# Patient Record
Sex: Male | Born: 1950 | Race: White | Hispanic: No | Marital: Married | Smoking: Current some day smoker
Health system: Southern US, Community
[De-identification: ages and names within clinical notes are randomized; demographics above are authoritative.]

## PROBLEM LIST (undated history)

## (undated) DIAGNOSIS — F319 Bipolar disorder, unspecified: Secondary | ICD-10-CM

## (undated) DIAGNOSIS — M1990 Unspecified osteoarthritis, unspecified site: Secondary | ICD-10-CM

## (undated) DIAGNOSIS — F32A Depression, unspecified: Secondary | ICD-10-CM

## (undated) DIAGNOSIS — F329 Major depressive disorder, single episode, unspecified: Secondary | ICD-10-CM

## (undated) DIAGNOSIS — H3530 Unspecified macular degeneration: Secondary | ICD-10-CM

## (undated) DIAGNOSIS — F419 Anxiety disorder, unspecified: Secondary | ICD-10-CM

## (undated) DIAGNOSIS — E785 Hyperlipidemia, unspecified: Secondary | ICD-10-CM

## (undated) DIAGNOSIS — B1920 Unspecified viral hepatitis C without hepatic coma: Secondary | ICD-10-CM

## (undated) HISTORY — DX: Anxiety disorder, unspecified: F41.9

## (undated) HISTORY — PX: ANKLE FRACTURE SURGERY: SHX122

## (undated) HISTORY — DX: Hyperlipidemia, unspecified: E78.5

## (undated) HISTORY — DX: Depression, unspecified: F32.A

## (undated) HISTORY — DX: Major depressive disorder, single episode, unspecified: F32.9

## (undated) HISTORY — DX: Bipolar disorder, unspecified: F31.9

## (undated) HISTORY — DX: Unspecified viral hepatitis C without hepatic coma: B19.20

## (undated) HISTORY — PX: COLONOSCOPY: SHX174

## (undated) HISTORY — DX: Unspecified macular degeneration: H35.30

## (undated) HISTORY — DX: Unspecified osteoarthritis, unspecified site: M19.90

## (undated) HISTORY — PX: ROTATOR CUFF REPAIR: SHX139

---

## 2001-06-07 HISTORY — PX: OTHER SURGICAL HISTORY: SHX169

## 2004-04-23 ENCOUNTER — Encounter: Admission: RE | Admit: 2004-04-23 | Discharge: 2004-04-23 | Payer: Self-pay | Admitting: Family Medicine

## 2004-04-23 ENCOUNTER — Ambulatory Visit: Payer: Self-pay | Admitting: Family Medicine

## 2004-05-01 ENCOUNTER — Encounter: Admission: RE | Admit: 2004-05-01 | Discharge: 2004-05-01 | Payer: Self-pay | Admitting: Family Medicine

## 2005-02-12 ENCOUNTER — Ambulatory Visit: Payer: Self-pay | Admitting: Family Medicine

## 2005-04-06 ENCOUNTER — Ambulatory Visit: Payer: Self-pay | Admitting: Family Medicine

## 2005-04-13 ENCOUNTER — Ambulatory Visit: Payer: Self-pay | Admitting: Family Medicine

## 2005-04-23 ENCOUNTER — Ambulatory Visit: Payer: Self-pay | Admitting: Internal Medicine

## 2005-06-23 ENCOUNTER — Ambulatory Visit: Payer: Self-pay | Admitting: Family Medicine

## 2005-07-09 ENCOUNTER — Ambulatory Visit: Payer: Self-pay | Admitting: Internal Medicine

## 2005-12-16 ENCOUNTER — Ambulatory Visit: Payer: Self-pay | Admitting: Family Medicine

## 2005-12-23 ENCOUNTER — Ambulatory Visit: Payer: Self-pay | Admitting: Family Medicine

## 2006-08-02 ENCOUNTER — Ambulatory Visit: Payer: Self-pay | Admitting: Family Medicine

## 2006-08-10 ENCOUNTER — Ambulatory Visit: Payer: Self-pay | Admitting: Family Medicine

## 2006-12-05 ENCOUNTER — Ambulatory Visit: Payer: Self-pay | Admitting: Family Medicine

## 2006-12-05 LAB — CONVERTED CEMR LAB
ALT: 25 units/L (ref 0–53)
AST: 30 units/L (ref 0–37)
Albumin: 4 g/dL (ref 3.5–5.2)
Alkaline Phosphatase: 56 units/L (ref 39–117)
BUN: 4 mg/dL — ABNORMAL LOW (ref 6–23)
Basophils Absolute: 0 10*3/uL (ref 0.0–0.1)
Basophils Relative: 0.6 % (ref 0.0–1.0)
Bilirubin, Direct: 0.1 mg/dL (ref 0.0–0.3)
CO2: 30 meq/L (ref 19–32)
Calcium: 9.3 mg/dL (ref 8.4–10.5)
Chloride: 112 meq/L (ref 96–112)
Cholesterol: 191 mg/dL (ref 0–200)
Creatinine, Ser: 0.9 mg/dL (ref 0.4–1.5)
Eosinophils Absolute: 0.1 10*3/uL (ref 0.0–0.6)
Eosinophils Relative: 1.8 % (ref 0.0–5.0)
GFR calc Af Amer: 112 mL/min
GFR calc non Af Amer: 93 mL/min
Glucose, Bld: 96 mg/dL (ref 70–99)
HCT: 45.6 % (ref 39.0–52.0)
HDL: 37.2 mg/dL — ABNORMAL LOW (ref >39.0)
Hemoglobin: 15.5 g/dL (ref 13.0–17.0)
LDL Cholesterol: 119 mg/dL — ABNORMAL HIGH (ref 0–99)
Lymphocytes Relative: 30.3 % (ref 12.0–46.0)
MCHC: 34 g/dL (ref 30.0–36.0)
MCV: 97.3 fL (ref 78.0–100.0)
Monocytes Absolute: 0.6 10*3/uL (ref 0.2–0.7)
Monocytes Relative: 7.9 % (ref 3.0–11.0)
Neutro Abs: 4.2 10*3/uL (ref 1.4–7.7)
Neutrophils Relative %: 59.4 % (ref 43.0–77.0)
PSA: 0.66 ng/mL (ref 0.10–4.00)
Platelets: 224 10*3/uL (ref 150–400)
Potassium: 4.1 meq/L (ref 3.5–5.1)
RBC: 4.69 M/uL (ref 4.22–5.81)
RDW: 12.2 % (ref 11.5–14.6)
Sodium: 145 meq/L (ref 135–145)
TSH: 0.98 microintl units/mL (ref 0.35–5.50)
Total Bilirubin: 0.6 mg/dL (ref 0.3–1.2)
Total CHOL/HDL Ratio: 5.1
Total Protein: 7 g/dL (ref 6.0–8.3)
Triglycerides: 174 mg/dL — ABNORMAL HIGH (ref 0–149)
VLDL: 35 mg/dL (ref 0–40)
WBC: 7 10*3/uL (ref 4.5–10.5)

## 2006-12-12 ENCOUNTER — Ambulatory Visit: Payer: Self-pay | Admitting: Family Medicine

## 2007-01-26 DIAGNOSIS — M1990 Unspecified osteoarthritis, unspecified site: Secondary | ICD-10-CM | POA: Insufficient documentation

## 2007-01-26 DIAGNOSIS — F418 Other specified anxiety disorders: Secondary | ICD-10-CM | POA: Insufficient documentation

## 2007-06-27 ENCOUNTER — Ambulatory Visit: Payer: Self-pay | Admitting: Internal Medicine

## 2007-06-27 DIAGNOSIS — B029 Zoster without complications: Secondary | ICD-10-CM | POA: Insufficient documentation

## 2008-08-30 ENCOUNTER — Ambulatory Visit: Payer: Self-pay | Admitting: Family Medicine

## 2008-08-30 LAB — CONVERTED CEMR LAB
ALT: 28 units/L (ref 0–53)
AST: 27 units/L (ref 0–37)
Albumin: 4.1 g/dL (ref 3.5–5.2)
Alkaline Phosphatase: 59 units/L (ref 39–117)
BUN: 14 mg/dL (ref 6–23)
Basophils Absolute: 0 10*3/uL (ref 0.0–0.1)
Basophils Relative: 0.8 % (ref 0.0–3.0)
Bilirubin Urine: NEGATIVE
Bilirubin, Direct: 0.1 mg/dL (ref 0.0–0.3)
CO2: 29 meq/L (ref 19–32)
Calcium: 9.1 mg/dL (ref 8.4–10.5)
Chloride: 112 meq/L (ref 96–112)
Cholesterol: 160 mg/dL (ref 0–200)
Creatinine, Ser: 0.9 mg/dL (ref 0.4–1.5)
Eosinophils Absolute: 0 10*3/uL (ref 0.0–0.7)
Eosinophils Relative: 0.1 % (ref 0.0–5.0)
GFR calc non Af Amer: 91.99 mL/min (ref >60)
Glucose, Bld: 108 mg/dL — ABNORMAL HIGH (ref 70–99)
HCT: 45.1 % (ref 39.0–52.0)
HDL: 40.4 mg/dL (ref >39.00)
Hemoglobin: 15.9 g/dL (ref 13.0–17.0)
Ketones, ur: NEGATIVE mg/dL
LDL Cholesterol: 99 mg/dL (ref 0–99)
Leukocytes, UA: NEGATIVE
Lymphocytes Relative: 32.5 % (ref 12.0–46.0)
Lymphs Abs: 1.7 10*3/uL (ref 0.7–4.0)
MCHC: 35.3 g/dL (ref 30.0–36.0)
MCV: 97.1 fL (ref 78.0–100.0)
Monocytes Absolute: 0.5 10*3/uL (ref 0.1–1.0)
Monocytes Relative: 9.2 % (ref 3.0–12.0)
Neutro Abs: 3.1 10*3/uL (ref 1.4–7.7)
Neutrophils Relative %: 57.4 % (ref 43.0–77.0)
Nitrite: NEGATIVE
PSA: 2.18 ng/mL (ref 0.10–4.00)
Platelets: 208 10*3/uL (ref 150.0–400.0)
Potassium: 4.8 meq/L (ref 3.5–5.1)
RBC: 4.64 M/uL (ref 4.22–5.81)
RDW: 12.2 % (ref 11.5–14.6)
Sodium: 145 meq/L (ref 135–145)
Specific Gravity, Urine: <=1.005 (ref 1.000–1.030)
TSH: 0.55 microintl units/mL (ref 0.35–5.50)
Total Bilirubin: 0.7 mg/dL (ref 0.3–1.2)
Total CHOL/HDL Ratio: 4
Total Protein, Urine: NEGATIVE mg/dL
Total Protein: 6.8 g/dL (ref 6.0–8.3)
Triglycerides: 101 mg/dL (ref 0.0–149.0)
Urine Glucose: NEGATIVE mg/dL
Urobilinogen, UA: 0.2 (ref 0.0–1.0)
VLDL: 20.2 mg/dL (ref 0.0–40.0)
WBC: 5.3 10*3/uL (ref 4.5–10.5)
pH: 5.5 (ref 5.0–8.0)

## 2008-09-10 ENCOUNTER — Encounter: Payer: Self-pay | Admitting: Family Medicine

## 2008-09-11 ENCOUNTER — Ambulatory Visit: Payer: Self-pay | Admitting: Family Medicine

## 2008-09-11 DIAGNOSIS — E785 Hyperlipidemia, unspecified: Secondary | ICD-10-CM | POA: Insufficient documentation

## 2008-09-11 DIAGNOSIS — F528 Other sexual dysfunction not due to a substance or known physiological condition: Secondary | ICD-10-CM | POA: Insufficient documentation

## 2009-11-26 ENCOUNTER — Telehealth: Payer: Self-pay | Admitting: Family Medicine

## 2009-12-19 ENCOUNTER — Ambulatory Visit: Payer: Self-pay | Admitting: Family Medicine

## 2009-12-19 LAB — CONVERTED CEMR LAB
ALT: 30 units/L (ref 0–53)
AST: 31 units/L (ref 0–37)
Albumin: 4.6 g/dL (ref 3.5–5.2)
Alkaline Phosphatase: 64 units/L (ref 39–117)
BUN: 9 mg/dL (ref 6–23)
Basophils Absolute: 0 10*3/uL (ref 0.0–0.1)
Basophils Relative: 0.7 % (ref 0.0–3.0)
Bilirubin Urine: NEGATIVE
Bilirubin, Direct: 0.1 mg/dL (ref 0.0–0.3)
CO2: 28 meq/L (ref 19–32)
Calcium: 9.2 mg/dL (ref 8.4–10.5)
Chloride: 112 meq/L (ref 96–112)
Cholesterol: 155 mg/dL (ref 0–200)
Creatinine, Ser: 0.9 mg/dL (ref 0.4–1.5)
Eosinophils Absolute: 0 10*3/uL (ref 0.0–0.7)
Eosinophils Relative: 0.1 % (ref 0.0–5.0)
GFR calc non Af Amer: 92.76 mL/min (ref >60)
Glucose, Bld: 89 mg/dL (ref 70–99)
HCT: 45.3 % (ref 39.0–52.0)
HDL: 44.2 mg/dL (ref >39.00)
Hemoglobin: 15.8 g/dL (ref 13.0–17.0)
Ketones, ur: NEGATIVE mg/dL
LDL Cholesterol: 96 mg/dL (ref 0–99)
Leukocytes, UA: NEGATIVE
Lymphocytes Relative: 26.8 % (ref 12.0–46.0)
Lymphs Abs: 1.8 10*3/uL (ref 0.7–4.0)
MCHC: 34.8 g/dL (ref 30.0–36.0)
MCV: 96.8 fL (ref 78.0–100.0)
Monocytes Absolute: 0.6 10*3/uL (ref 0.1–1.0)
Monocytes Relative: 8.3 % (ref 3.0–12.0)
Neutro Abs: 4.3 10*3/uL (ref 1.4–7.7)
Neutrophils Relative %: 64.1 % (ref 43.0–77.0)
Nitrite: NEGATIVE
PSA: 2.69 ng/mL (ref 0.10–4.00)
Platelets: 202 10*3/uL (ref 150.0–400.0)
Potassium: 4.5 meq/L (ref 3.5–5.1)
RBC: 4.69 M/uL (ref 4.22–5.81)
RDW: 12.7 % (ref 11.5–14.6)
Sodium: 142 meq/L (ref 135–145)
Specific Gravity, Urine: <=1.005 (ref 1.000–1.030)
TSH: 0.53 microintl units/mL (ref 0.35–5.50)
Total Bilirubin: 0.5 mg/dL (ref 0.3–1.2)
Total CHOL/HDL Ratio: 4
Total Protein, Urine: NEGATIVE mg/dL
Total Protein: 7.5 g/dL (ref 6.0–8.3)
Triglycerides: 74 mg/dL (ref 0.0–149.0)
Urine Glucose: NEGATIVE mg/dL
Urobilinogen, UA: 0.2 (ref 0.0–1.0)
VLDL: 14.8 mg/dL (ref 0.0–40.0)
WBC: 6.8 10*3/uL (ref 4.5–10.5)
pH: 7 (ref 5.0–8.0)

## 2009-12-26 ENCOUNTER — Ambulatory Visit: Payer: Self-pay | Admitting: Family Medicine

## 2010-07-07 NOTE — Progress Notes (Signed)
Summary: REQUEST FOR REFILL OF MEDICATION (CPX SCHEDULED FOR 12/26/2009)  Phone Note Refill Request Message from:  Patient's wife on November 26, 2009 12:45 PM  Refills Requested: Medication #1:  SIMVASTATIN 20 MG TABS 1 once daily   Notes: [facility] [address].... Pt would like a refill amt for enough of medication to do him till his CPX appt on 12/26/2009.    Initial call taken by: Debbra Riding,  November 26, 2009 12:45 PM    Prescriptions: SIMVASTATIN 20 MG TABS (SIMVASTATIN) 1 once daily  #30 x 0   Entered by:   Kern Reap CMA (AAMA)   Authorized by:   Roderick Pee MD   Signed by:   Kern Reap CMA (AAMA) on 11/26/2009   Method used:   [facility]        [facility]  [address] [phone]* (retail)       [address]       [place], [place]  [address]       Ph: [phone] or [phone]       Fax: [phone]   RxID:   [id number]

## 2010-07-07 NOTE — Assessment & Plan Note (Signed)
Summary: CPX // RS   Vital Signs:  Patient profile:   60 year old male Height:      67.25 inches Weight:      145 pounds BMI:     22.62 Temp:     98.4 degrees F oral BP sitting:   120 / 60  (left arm) Cuff size:   regular  Vitals Entered By: Kern Reap CMA Duncan Dull) (December 26, 2009 1:42 PM)  CC: cpx   CC:  cpx.  History of Present Illness: John Holland is a 60 year old, married male, smoker 10 cigarettes. per day........ who declines to try a smoking cessation program....... who comes in today for annual physical examination  He takes simvastatin 20 mg daily for hyperlipidemia.  Lipids are ago with an LDL of 96.  Dr. Nolen Mu, his psychiatrist has him on Lamictal 150 mg daily, Seroquel 20 mg b.i.d., Valium, 5 -- 10 -- 5 to control anxiety and depression.  He declines Viagra.  He gets routine eye care.  Dental care.  Colonoscopy normal.  Tetanus 2008.  Allergies: 1)  Codeine Phosphate (Codeine Phosphate)  Past History:  Past medical, surgical, family and social histories (including risk factors) reviewed, and no changes noted (except as noted below).  Past Medical History: Reviewed history from 01/26/2007 and no changes required. Depression Osteoarthritis  Past Surgical History: Reviewed history from 01/26/2007 and no changes required. Colonoscopy-07/09/2005  Family History: Reviewed history from 01/26/2007 and no changes required. Family History of Alcoholism/Addiction Fam hx MI Fam hx COPD  Social History: Reviewed history from 09/11/2008 and no changes required. Married Current Smoker Alcohol use-no Drug use-no Regular exercise-no  Review of Systems      See HPI  Physical Exam  General:  Well-developed,well-nourished,in no acute distress; alert,appropriate and cooperative throughout examination Head:  Normocephalic and atraumatic without obvious abnormalities. No apparent alopecia or balding. Eyes:  No corneal or conjunctival inflammation noted. EOMI.  Perrla. Funduscopic exam benign, without hemorrhages, exudates or papilledema. Vision grossly normal. Ears:  External ear exam shows no significant lesions or deformities.  Otoscopic examination reveals clear canals, tympanic membranes are intact bilaterally without bulging, retraction, inflammation or discharge. Hearing is grossly normal bilaterally. Nose:  External nasal examination shows no deformity or inflammation. Nasal mucosa are pink and moist without lesions or exudates. Mouth:  Oral mucosa and oropharynx without lesions or exudates.  Teeth in good repair. Neck:  No deformities, masses, or tenderness noted. Chest Wall:  No deformities, masses, tenderness or gynecomastia noted. Breasts:  No masses or gynecomastia noted Lungs:  Normal respiratory effort, chest expands symmetrically. Lungs are clear to auscultation, no crackles or wheezes. Heart:  Normal rate and regular rhythm. S1 and S2 normal without gallop, murmur, click, rub or other extra sounds. Abdomen:  Bowel sounds positive,abdomen soft and non-tender without masses, organomegaly or hernias noted. Rectal:  No external abnormalities noted. Normal sphincter tone. No rectal masses or tenderness. Genitalia:  Testes bilaterally descended without nodularity, tenderness or masses. No scrotal masses or lesions. No penis lesions or urethral discharge. Prostate:  Prostate gland firm and smooth, no enlargement, nodularity, tenderness, mass, asymmetry or induration. Msk:  No deformity or scoliosis noted of thoracic or lumbar spine.   Pulses:  R and L carotid,radial,femoral,dorsalis pedis and posterior tibial pulses are full and equal bilaterally Extremities:  No clubbing, cyanosis, edema, or deformity noted with normal full range of motion of all joints.   Neurologic:  No cranial nerve deficits noted. Station and gait are normal. Plantar reflexes are down-going  bilaterally. DTRs are symmetrical throughout. Sensory, motor and coordinative  functions appear intact. Skin:  Intact without suspicious lesions or rashes Cervical Nodes:  No lymphadenopathy noted Axillary Nodes:  No palpable lymphadenopathy Inguinal Nodes:  No significant adenopathy Psych:  Cognition and judgment appear intact. Alert and cooperative with normal attention span and concentration. No apparent delusions, illusions, hallucinations   Impression & Recommendations:  Problem # 1:  TOBACCO ABUSE (ICD-305.1) Assessment Unchanged  The following medications were removed from the medication list:    Chantix Starting Month Pak 0.5 Mg X 11 & 1 Mg X 42 Tabs (Varenicline tartrate) ..... Uad  Orders: Prescription Created Electronically [phone])  Problem # 2:  HYPERLIPIDEMIA (ICD-272.4) Assessment: Improved  His updated medication list for this problem includes:    Simvastatin 20 Mg Tabs (Simvastatin) .Marland Kitchen... 1 once daily  Orders: Prescription Created Electronically [phone]) EKG w/ Interpretation (93000)  Problem # 3:  DEPRESSION (ICD-311) Assessment: Improved  His updated medication list for this problem includes:    Valium 5 Mg Tabs (Diazepam) .Marland Kitchen... 1 three times a day  Problem # 4:  Preventive Health Care (ICD-V70.0) Assessment: Unchanged  Complete Medication List: 1)  Simvastatin 20 Mg Tabs (Simvastatin) .Marland Kitchen.. 1 once daily 2)  Lamictal 150 Mg Tabs (Lamotrigine) .Marland Kitchen.. 1 once daily 3)  Seroquel 100 Mg Tabs (Quetiapine fumarate) .... Take 2 half tabs in the am and 2 tabs at bedtime 4)  Valium 5 Mg Tabs (Diazepam) .Marland Kitchen.. 1 three times a day 5)  Viagra 50 Mg Tabs (Sildenafil citrate) .... Uad  Patient Instructions: 1)  Please schedule a follow-up appointment in 1 year. 2)  Please schedule a follow-up appointment as needed. Prescriptions: SIMVASTATIN 20 MG TABS (SIMVASTATIN) 1 once daily  #100 x 3   Entered and Authorized by:   Roderick Pee MD   Signed by:   Roderick Pee MD on 12/26/2009   Method used:   [facility]        [facility]  [address] [phone]* (retail)       [address]       [place], [place]  [address]       Ph: [phone] or [phone]       Fax: [phone]   RxID:   [id number]

## 2010-11-09 ENCOUNTER — Telehealth: Payer: Self-pay | Admitting: *Deleted

## 2010-11-09 DIAGNOSIS — M1990 Unspecified osteoarthritis, unspecified site: Secondary | ICD-10-CM

## 2010-11-09 NOTE — Telephone Encounter (Signed)
John Holland

## 2010-11-09 NOTE — Telephone Encounter (Signed)
Pt would like to have the referral to the hand surgeon that he and Dr. Tawanna Cooler talked about.

## 2010-11-10 NOTE — Telephone Encounter (Signed)
Pt called back and is req status of getting a referral. Pls call asap.

## 2011-01-13 ENCOUNTER — Other Ambulatory Visit: Payer: Self-pay | Admitting: *Deleted

## 2011-01-13 MED ORDER — SIMVASTATIN 20 MG PO TABS: 20.0000 mg | ORAL_TABLET | Freq: Every day | ORAL | Status: DC

## 2011-01-21 ENCOUNTER — Other Ambulatory Visit: Payer: Self-pay | Admitting: *Deleted

## 2011-01-21 MED ORDER — ACETAMINOPHEN-CODEINE #3 300-30 MG PO TABS: 1.0000 | ORAL_TABLET | ORAL | Status: AC | PRN

## 2011-01-21 NOTE — Telephone Encounter (Signed)
Patient request rx.  Ok per Dr Tawanna Cooler.

## 2011-03-05 ENCOUNTER — Emergency Department (HOSPITAL_COMMUNITY): Payer: BC Managed Care – PPO

## 2011-03-05 ENCOUNTER — Emergency Department (HOSPITAL_COMMUNITY)
Admission: EM | Admit: 2011-03-05 | Discharge: 2011-03-05 | Disposition: A | Discharge status: Home / Self Care | Payer: BC Managed Care – PPO | Attending: Emergency Medicine | Admitting: Emergency Medicine

## 2011-03-05 DIAGNOSIS — Z23 Encounter for immunization: Secondary | ICD-10-CM | POA: Insufficient documentation

## 2011-03-05 DIAGNOSIS — F3289 Other specified depressive episodes: Secondary | ICD-10-CM | POA: Insufficient documentation

## 2011-03-05 DIAGNOSIS — F329 Major depressive disorder, single episode, unspecified: Secondary | ICD-10-CM | POA: Insufficient documentation

## 2011-03-05 DIAGNOSIS — S62639B Displaced fracture of distal phalanx of unspecified finger, initial encounter for open fracture: Secondary | ICD-10-CM | POA: Insufficient documentation

## 2011-03-05 DIAGNOSIS — W309XXA Contact with unspecified agricultural machinery, initial encounter: Secondary | ICD-10-CM | POA: Insufficient documentation

## 2011-03-05 DIAGNOSIS — F411 Generalized anxiety disorder: Secondary | ICD-10-CM | POA: Insufficient documentation

## 2011-03-31 ENCOUNTER — Other Ambulatory Visit: Payer: Self-pay | Admitting: Family Medicine

## 2011-07-01 ENCOUNTER — Other Ambulatory Visit: Payer: Self-pay | Admitting: *Deleted

## 2011-07-01 MED ORDER — SIMVASTATIN 20 MG PO TABS: 20.0000 mg | ORAL_TABLET | Freq: Every day | ORAL | Status: DC

## 2011-08-23 ENCOUNTER — Encounter: Payer: Self-pay | Admitting: Internal Medicine

## 2011-09-09 ENCOUNTER — Encounter: Payer: Self-pay | Admitting: Internal Medicine

## 2011-10-11 ENCOUNTER — Encounter: Payer: Self-pay | Admitting: Internal Medicine

## 2011-10-11 ENCOUNTER — Ambulatory Visit (AMBULATORY_SURGERY_CENTER): Payer: BC Managed Care – PPO | Admitting: *Deleted

## 2011-10-11 VITALS — Ht 68.0 in | Wt 146.5 lb

## 2011-10-11 DIAGNOSIS — Z1211 Encounter for screening for malignant neoplasm of colon: Secondary | ICD-10-CM

## 2011-10-11 MED ORDER — PEG-KCL-NACL-NASULF-NA ASC-C 100 G PO SOLR: ORAL | Status: DC

## 2011-10-25 ENCOUNTER — Encounter: Payer: BC Managed Care – PPO | Admitting: Internal Medicine

## 2011-10-27 ENCOUNTER — Encounter: Payer: Self-pay | Admitting: Internal Medicine

## 2011-10-27 ENCOUNTER — Ambulatory Visit (AMBULATORY_SURGERY_CENTER): Payer: BC Managed Care – PPO | Admitting: Internal Medicine

## 2011-10-27 VITALS — BP 157/95 | HR 71 | Temp 97.0°F | Resp 20 | Ht 68.0 in | Wt 146.0 lb

## 2011-10-27 DIAGNOSIS — D126 Benign neoplasm of colon, unspecified: Secondary | ICD-10-CM

## 2011-10-27 DIAGNOSIS — Z1211 Encounter for screening for malignant neoplasm of colon: Secondary | ICD-10-CM

## 2011-10-27 MED ORDER — SODIUM CHLORIDE 0.9 % IV SOLN: 500.0000 mL | INTRAVENOUS | Status: DC

## 2011-10-27 NOTE — Op Note (Signed)
[facility] [address] [place], [place]  [address]  COLONOSCOPY PROCEDURE REPORT  PATIENT:  John Holland, John Holland  MR#:  [id number] BIRTHDATE:  07-27-1950, 60 yrs. old  GENDER:  male ENDOSCOPIST:  Wilhemina Bonito. Eda Keys, MD REF. BY:  Screening / Recall PROCEDURE DATE:  10/27/2011 PROCEDURE:  Colonoscopy with snare polypectomy x 1 ASA CLASS:  Class II INDICATIONS:  Screening MEDICATIONS:   MAC sedation, administered by CRNA, propofol (Diprivan) 220 mg IV  DESCRIPTION OF PROCEDURE:   After the risks benefits and alternatives of the procedure were thoroughly explained, informed consent was obtained.  Digital rectal exam was performed and revealed no abnormalities.   The LB [id number] endoscope was introduced through the anus and advanced to the cecum, which was identified by both the appendix and ileocecal valve, without limitations.  The quality of the prep was good, using MoviPrep. The instrument was then slowly withdrawn as the colon was fully examined. <<PROCEDUREIMAGES>>  FINDINGS:  A diminutive polyp was found in the sigmoid colon and snared without cautery. Retrieval was successful. Moderate diverticulosis was found in the sigmoid colon.  Otherwise normal colonoscopy without other polyps, masses, vascular ectasias, or inflammatory changes.   Retroflexed views in the rectum revealed no abnormalities.    The time to cecum = 2:13  minutes. The scope was then withdrawn in 13:33  minutes from the cecum and the procedure completed.  COMPLICATIONS:  None  ENDOSCOPIC IMPRESSION: 1) Diminutive polyp in the sigmoid colon - removed 2) Moderate diverticulosis in the sigmoid colon 3) Otherwise normal colonoscopy  RECOMMENDATIONS: 1) Repeat colonoscopy in 5 years if polyp adenomatous; otherwise 10 years  ______________________________ Wilhemina Bonito. Eda Keys, MD  CC:  The Patient;  Roderick Pee, MD  n. Rosalie DoctorWilhemina Bonito. Eda Keys at 10/27/2011 02:21 PM  [name],  [phone]

## 2011-10-27 NOTE — Progress Notes (Signed)
Patient did not experience any of the following events: a burn prior to discharge; a fall within the facility; wrong site/side/patient/procedure/implant event; or a hospital transfer or hospital admission upon discharge from the facility. (G8907) Patient did not have preoperative order for IV antibiotic SSI prophylaxis. (G8918)  

## 2011-10-27 NOTE — Patient Instructions (Signed)

## 2011-10-28 ENCOUNTER — Telehealth: Payer: Self-pay | Admitting: *Deleted

## 2011-10-28 NOTE — Telephone Encounter (Signed)
  Follow up Call-  Call back number 10/27/2011  Post procedure Call Back phone  # [phone]  Permission to leave phone message Yes     Patient questions:  Do you have a fever, pain , or abdominal swelling? no Pain Score  0 *  Have you tolerated food without any problems? yes  Have you been able to return to your normal activities? yes  Do you have any questions about your discharge instructions: Diet   no Medications  no Follow up visit  no  Do you have questions or concerns about your Care? no  Actions: * If pain score is 4 or above: No action needed, pain <4.

## 2011-11-03 ENCOUNTER — Encounter: Payer: Self-pay | Admitting: Internal Medicine

## 2011-11-04 ENCOUNTER — Other Ambulatory Visit: Payer: Self-pay | Admitting: Orthopedic Surgery

## 2011-11-05 ENCOUNTER — Encounter (HOSPITAL_BASED_OUTPATIENT_CLINIC_OR_DEPARTMENT_OTHER): Payer: Self-pay | Admitting: *Deleted

## 2011-11-05 NOTE — Progress Notes (Signed)
Told he was staying [facility] To bring all meds,overnight bag No labs needed

## 2011-11-08 NOTE — H&P (Signed)
  John Holland is an 61 y.o. male.   Chief Complaint: c/o chronic and persistent right shoulder pain and limited ROM HPI:.  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  He has had history of bilateral hand pain right greater than left for the past one year.  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  He has right shoulder pain that bothers him at night.  He has to roll off the right shoulder.  He still has full motion and excellent strength. He denies any numbness.  He has no antecedent history of injury.  His ancestors are Solicitor.  He is aware of family history of some arthrosis.  He is 5'8" tall and weighs 140 pounds.  He has been using Aleve 440 mg. in the morning, 440 mg. in the evening for his pain without stomach upset.     Past Medical History  Diagnosis Date  . Bipolar 1 disorder   . Anxiety   . Depression   . Hyperlipidemia   . Arthritis   . Hyperlipemia     Past Surgical History  Procedure Date  . Arthroscopy arm 2003    right elbow bone frag  . Ankle fracture surgery     age 60-lt  . Colonoscopy     Family History  Problem Relation Age of Onset  . Colon cancer Neg Hx   . Stomach cancer Neg Hx    Social History:  reports that he quit smoking about 2 years ago. He has never used smokeless tobacco. He reports that he does not drink alcohol or use illicit drugs.  Allergies:  Allergies  Allergen Reactions  . Codeine Phosphate Nausea And Vomiting    No prescriptions prior to admission    No results found for this or any previous visit (from the past 48 hour(s)).  No results found.   Pertinent items are noted in HPI.  There were no vitals taken for this visit.  General appearance: alert Head: Normocephalic, without obvious abnormality Neck: supple, symmetrical, trachea  midline Resp: clear to auscultation bilaterally Cardio: regular rate and rhythm GI: normal findings: bowel sounds normal Extremities: On examination he has elevation to 165 degrees right vs. 170 left.  He is weak in scaption, external rotation at neutral and in 90 degrees abduction.  He has positive impingement sign and a clicking beneath the acromion with rapid elevation of his shoulder.   Plain x-ray of his shoulder demonstrates significant AC arthrosis and remodeling at the greater tuberosity consistent with rotator cuff tear.   MRI was obtained at [facility] on 09/17/11.  This reveals a large retracted tear of the supraspinatus, significant interstitial tearing of the infraspinatus and unfavorable AC anatomy.  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 The procedure, risks and post-op course were discussed at length and the patient was in agreement with the plan.  DASNOIT,Lekeith Wulf J 11/08/2011, 12:47 PM    H&P documentation: 11/09/2011  -History and Physical Reviewed  -Patient has been re-examined  -No change in the plan of care  Wyn Forster, MD

## 2011-11-09 ENCOUNTER — Encounter (HOSPITAL_BASED_OUTPATIENT_CLINIC_OR_DEPARTMENT_OTHER): Payer: Self-pay | Admitting: Anesthesiology

## 2011-11-09 ENCOUNTER — Encounter (HOSPITAL_BASED_OUTPATIENT_CLINIC_OR_DEPARTMENT_OTHER): Payer: Self-pay

## 2011-11-09 ENCOUNTER — Ambulatory Visit (HOSPITAL_BASED_OUTPATIENT_CLINIC_OR_DEPARTMENT_OTHER): Payer: BC Managed Care – PPO | Admitting: Anesthesiology

## 2011-11-09 ENCOUNTER — Ambulatory Visit (HOSPITAL_BASED_OUTPATIENT_CLINIC_OR_DEPARTMENT_OTHER)
Admission: RE | Admit: 2011-11-09 | Discharge: 2011-11-10 | Disposition: A | Discharge status: Home / Self Care | Payer: BC Managed Care – PPO | Source: Ambulatory Visit | Attending: Orthopedic Surgery | Admitting: Orthopedic Surgery

## 2011-11-09 ENCOUNTER — Encounter (HOSPITAL_BASED_OUTPATIENT_CLINIC_OR_DEPARTMENT_OTHER)
Admission: RE | Disposition: A | Discharge status: Home / Self Care | Payer: Self-pay | Source: Ambulatory Visit | Attending: Orthopedic Surgery

## 2011-11-09 DIAGNOSIS — F3130 Bipolar disorder, current episode depressed, mild or moderate severity, unspecified: Secondary | ICD-10-CM | POA: Insufficient documentation

## 2011-11-09 DIAGNOSIS — M719 Bursopathy, unspecified: Secondary | ICD-10-CM | POA: Insufficient documentation

## 2011-11-09 DIAGNOSIS — M67919 Unspecified disorder of synovium and tendon, unspecified shoulder: Secondary | ICD-10-CM | POA: Insufficient documentation

## 2011-11-09 DIAGNOSIS — E785 Hyperlipidemia, unspecified: Secondary | ICD-10-CM | POA: Insufficient documentation

## 2011-11-09 DIAGNOSIS — M19019 Primary osteoarthritis, unspecified shoulder: Secondary | ICD-10-CM | POA: Insufficient documentation

## 2011-11-09 DIAGNOSIS — Z5333 Arthroscopic surgical procedure converted to open procedure: Secondary | ICD-10-CM | POA: Insufficient documentation

## 2011-11-09 DIAGNOSIS — M24119 Other articular cartilage disorders, unspecified shoulder: Secondary | ICD-10-CM | POA: Insufficient documentation

## 2011-11-09 LAB — POCT HEMOGLOBIN-HEMACUE: Hemoglobin: 13.3 g/dL (ref 13.0–17.0)

## 2011-11-09 SURGERY — ARTHROSCOPY, SHOULDER, WITH ROTATOR CUFF REPAIR
Anesthesia: General | Site: Shoulder | Laterality: Right | Wound class: Clean

## 2011-11-09 MED ORDER — METHOCARBAMOL 500 MG PO TABS: 500.0000 mg | ORAL_TABLET | Freq: Four times a day (QID) | ORAL | Status: DC | PRN

## 2011-11-09 MED ORDER — HYDROMORPHONE HCL PF 1 MG/ML IJ SOLN: 0.2500 mg | INTRAMUSCULAR | Status: DC | PRN

## 2011-11-09 MED ORDER — HYDROMORPHONE HCL 4 MG PO TABS
4.0000 mg | ORAL_TABLET | ORAL | Status: DC | PRN
  Administered 2011-11-10 (×2): 4 mg via ORAL

## 2011-11-09 MED ORDER — HYDROMORPHONE HCL 2 MG PO TABS: ORAL_TABLET | ORAL | Status: AC

## 2011-11-09 MED ORDER — EPHEDRINE SULFATE 50 MG/ML IJ SOLN
INTRAMUSCULAR | Status: DC | PRN
  Administered 2011-11-09: 10 mg via INTRAVENOUS

## 2011-11-09 MED ORDER — SODIUM CHLORIDE 0.9 % IV SOLN
INTRAVENOUS | Status: DC
  Administered 2011-11-09: 16:00:00 via INTRAVENOUS

## 2011-11-09 MED ORDER — FENTANYL CITRATE 0.05 MG/ML IJ SOLN
100.0000 ug | INTRAMUSCULAR | Status: DC | PRN
  Administered 2011-11-09: 100 ug via INTRAVENOUS

## 2011-11-09 MED ORDER — METOCLOPRAMIDE HCL 5 MG PO TABS: 5.0000 mg | ORAL_TABLET | Freq: Three times a day (TID) | ORAL | Status: DC | PRN

## 2011-11-09 MED ORDER — SODIUM CHLORIDE 0.9 % IR SOLN
Status: DC | PRN
  Administered 2011-11-09: 2

## 2011-11-09 MED ORDER — METOCLOPRAMIDE HCL 5 MG/ML IJ SOLN: 5.0000 mg | Freq: Three times a day (TID) | INTRAMUSCULAR | Status: DC | PRN

## 2011-11-09 MED ORDER — CEFAZOLIN SODIUM 1-5 GM-% IV SOLN
INTRAVENOUS | Status: DC | PRN
  Administered 2011-11-09: 1 g via INTRAVENOUS

## 2011-11-09 MED ORDER — ONDANSETRON HCL 4 MG/2ML IJ SOLN: 4.0000 mg | Freq: Four times a day (QID) | INTRAMUSCULAR | Status: DC | PRN

## 2011-11-09 MED ORDER — CEFAZOLIN SODIUM 1-5 GM-% IV SOLN
1.0000 g | Freq: Four times a day (QID) | INTRAVENOUS | Status: DC
  Administered 2011-11-09 – 2011-11-10 (×2): 1 g via INTRAVENOUS

## 2011-11-09 MED ORDER — HYDROMORPHONE HCL PF 1 MG/ML IJ SOLN: 0.5000 mg | INTRAMUSCULAR | Status: DC | PRN

## 2011-11-09 MED ORDER — DEXAMETHASONE SODIUM PHOSPHATE 4 MG/ML IJ SOLN
INTRAMUSCULAR | Status: DC | PRN
  Administered 2011-11-09: 10 mg via INTRAVENOUS

## 2011-11-09 MED ORDER — HYDROMORPHONE HCL 2 MG PO TABS: 2.0000 mg | ORAL_TABLET | ORAL | Status: DC | PRN

## 2011-11-09 MED ORDER — HYDROMORPHONE HCL 2 MG PO TABS: 2.0000 mg | ORAL_TABLET | ORAL | Status: AC | PRN

## 2011-11-09 MED ORDER — CHLORHEXIDINE GLUCONATE 4 % EX LIQD: 60.0000 mL | Freq: Once | CUTANEOUS | Status: DC

## 2011-11-09 MED ORDER — ROPIVACAINE HCL 5 MG/ML IJ SOLN
INTRAMUSCULAR | Status: DC | PRN
  Administered 2011-11-09: 30 mL via EPIDURAL

## 2011-11-09 MED ORDER — ONDANSETRON HCL 4 MG/2ML IJ SOLN
INTRAMUSCULAR | Status: DC | PRN
  Administered 2011-11-09: 4 mg via INTRAVENOUS

## 2011-11-09 MED ORDER — ONDANSETRON HCL 4 MG PO TABS: 4.0000 mg | ORAL_TABLET | Freq: Four times a day (QID) | ORAL | Status: DC | PRN

## 2011-11-09 MED ORDER — METHOCARBAMOL 100 MG/ML IJ SOLN: 500.0000 mg | Freq: Four times a day (QID) | INTRAVENOUS | Status: DC | PRN

## 2011-11-09 MED ORDER — CEPHALEXIN 500 MG PO CAPS: 500.0000 mg | ORAL_CAPSULE | Freq: Three times a day (TID) | ORAL | Status: AC

## 2011-11-09 MED ORDER — ZOLPIDEM TARTRATE 5 MG PO TABS: 5.0000 mg | ORAL_TABLET | Freq: Every evening | ORAL | Status: DC | PRN

## 2011-11-09 MED ORDER — GLYCOPYRROLATE 0.2 MG/ML IJ SOLN
INTRAMUSCULAR | Status: DC | PRN
  Administered 2011-11-09: 0.2 mg via INTRAVENOUS

## 2011-11-09 MED ORDER — MIDAZOLAM HCL 2 MG/2ML IJ SOLN
2.0000 mg | INTRAMUSCULAR | Status: DC | PRN
  Administered 2011-11-09: 2 mg via INTRAVENOUS

## 2011-11-09 MED ORDER — PROPOFOL 10 MG/ML IV EMUL
INTRAVENOUS | Status: DC | PRN
  Administered 2011-11-09: 150 mg via INTRAVENOUS

## 2011-11-09 MED ORDER — FENTANYL CITRATE 0.05 MG/ML IJ SOLN
INTRAMUSCULAR | Status: DC | PRN
  Administered 2011-11-09: 100 ug via INTRAVENOUS

## 2011-11-09 MED ORDER — SUCCINYLCHOLINE CHLORIDE 20 MG/ML IJ SOLN
INTRAMUSCULAR | Status: DC | PRN
  Administered 2011-11-09: 100 mg via INTRAVENOUS

## 2011-11-09 MED ORDER — LACTATED RINGERS IV SOLN
INTRAVENOUS | Status: DC
  Administered 2011-11-09 (×3): via INTRAVENOUS

## 2011-11-09 SURGICAL SUPPLY — 82 items
ANCH SUT 2 FT CRKSW 14.7X5.5 (Anchor) ×1 IMPLANT
ANCH SUT SWLK 19.1 CLS EYLT TL (Anchor) ×1 IMPLANT
ANCH SUT SWLK 19.1X4.75 (Anchor) ×2 IMPLANT
ANCHOR CORKSCREW FIBER 5.5X15 (Anchor) ×1 IMPLANT
ANCHOR SUT BIO SW 4.75 W/FIB (Anchor) ×1 IMPLANT
ANCHOR SUT BIO SW 4.75X19.1 (Anchor) ×2 IMPLANT
BANDAGE ADHESIVE 1X3 (GAUZE/BANDAGES/DRESSINGS) IMPLANT
BLADE AVERAGE 25X9 (BLADE) IMPLANT
BLADE CUTTER MENIS 5.5 (BLADE) IMPLANT
BLADE SURG 15 STRL LF DISP TIS (BLADE) ×2 IMPLANT
BLADE SURG 15 STRL SS (BLADE) ×4
BUR EGG 3PK/BX (BURR) IMPLANT
BUR OVAL 6.0 (BURR) ×2 IMPLANT
CANISTER OMNI JUG 16 LITER (MISCELLANEOUS) ×3 IMPLANT
CANISTER SUCTION 2500CC (MISCELLANEOUS) IMPLANT
CANNULA 5.75X7 CRYSTAL CLEAR (CANNULA) IMPLANT
CANNULA SHOULDER 7CM (CANNULA) IMPLANT
CANNULA TWIST IN 8.25X7CM (CANNULA) IMPLANT
CLEANER CAUTERY TIP 5X5 PAD (MISCELLANEOUS) IMPLANT
CLOTH BEACON ORANGE TIMEOUT ST (SAFETY) ×2 IMPLANT
CUTTER MENISCUS  4.2MM (BLADE) ×1
CUTTER MENISCUS 4.2MM (BLADE) ×1 IMPLANT
DECANTER SPIKE VIAL GLASS SM (MISCELLANEOUS) IMPLANT
DRAPE INCISE IOBAN 66X45 STRL (DRAPES) ×2 IMPLANT
DRAPE STERI 35X30 U-POUCH (DRAPES) ×2 IMPLANT
DRAPE SURG 17X23 STRL (DRAPES) ×1 IMPLANT
DRAPE U-SHAPE 47X51 STRL (DRAPES) ×2 IMPLANT
DRAPE U-SHAPE 76X120 STRL (DRAPES) ×4 IMPLANT
DRSG PAD ABDOMINAL 8X10 ST (GAUZE/BANDAGES/DRESSINGS) ×2 IMPLANT
DURAPREP 26ML APPLICATOR (WOUND CARE) ×2 IMPLANT
ELECT REM PT RETURN 9FT ADLT (ELECTROSURGICAL) ×2
ELECTRODE REM PT RTRN 9FT ADLT (ELECTROSURGICAL) IMPLANT
GLOVE BIO SURGEON STRL SZ 6.5 (GLOVE) ×1 IMPLANT
GLOVE BIOGEL M STRL SZ7.5 (GLOVE) ×2 IMPLANT
GLOVE BIOGEL PI IND STRL 8 (GLOVE) ×2 IMPLANT
GLOVE BIOGEL PI INDICATOR 8 (GLOVE) ×2
GLOVE ORTHO TXT STRL SZ7.5 (GLOVE) ×2 IMPLANT
GOWN BRE IMP PREV XXLGXLNG (GOWN DISPOSABLE) ×4 IMPLANT
GOWN PREVENTION PLUS XLARGE (GOWN DISPOSABLE) ×2 IMPLANT
NDL SCORPION (NEEDLE) ×1 IMPLANT
NDL SUT 6 .5 CRC .975X.05 MAYO (NEEDLE) IMPLANT
NEEDLE MAYO TAPER (NEEDLE)
NEEDLE MINI RC 24MM (NEEDLE) IMPLANT
NEEDLE SCORPION (NEEDLE) IMPLANT
PACK ARTHROSCOPY DSU (CUSTOM PROCEDURE TRAY) ×2 IMPLANT
PACK BASIN DAY SURGERY FS (CUSTOM PROCEDURE TRAY) ×2 IMPLANT
PAD CLEANER CAUTERY TIP 5X5 (MISCELLANEOUS)
PASSER SUT SWANSON 36MM LOOP (INSTRUMENTS) IMPLANT
PENCIL BUTTON HOLSTER BLD 10FT (ELECTRODE) IMPLANT
SLEEVE SCD COMPRESS KNEE MED (MISCELLANEOUS) ×2 IMPLANT
SLING ARM FOAM STRAP LRG (SOFTGOODS) ×1 IMPLANT
SLING ARM FOAM STRAP MED (SOFTGOODS) IMPLANT
SPONGE GAUZE 4X4 12PLY (GAUZE/BANDAGES/DRESSINGS) ×2 IMPLANT
SPONGE LAP 4X18 X RAY DECT (DISPOSABLE) ×2 IMPLANT
STRIP CLOSURE SKIN 1/2X4 (GAUZE/BANDAGES/DRESSINGS) IMPLANT
SUCTION FRAZIER TIP 10 FR DISP (SUCTIONS) IMPLANT
SUT ETHIBOND 2 OS 4 DA (SUTURE) IMPLANT
SUT ETHILON 4 0 PS 2 18 (SUTURE) IMPLANT
SUT FIBERWIRE #2 38 T-5 BLUE (SUTURE)
SUT FIBERWIRE 3-0 18 TAPR NDL (SUTURE)
SUT PROLENE 1 CT (SUTURE) IMPLANT
SUT PROLENE 3 0 PS 2 (SUTURE) ×2 IMPLANT
SUT VIC AB 0 CT1 27 (SUTURE)
SUT VIC AB 0 CT1 27XBRD ANBCTR (SUTURE) IMPLANT
SUT VIC AB 0 SH 27 (SUTURE) IMPLANT
SUT VIC AB 2-0 SH 27 (SUTURE)
SUT VIC AB 2-0 SH 27XBRD (SUTURE) IMPLANT
SUT VIC AB 3-0 SH 27 (SUTURE)
SUT VIC AB 3-0 SH 27X BRD (SUTURE) IMPLANT
SUT VIC AB 3-0 X1 27 (SUTURE) IMPLANT
SUTURE FIBERWR #2 38 T-5 BLUE (SUTURE) IMPLANT
SUTURE FIBERWR 3-0 18 TAPR NDL (SUTURE) IMPLANT
SYR 3ML 23GX1 SAFETY (SYRINGE) IMPLANT
SYR BULB 3OZ (MISCELLANEOUS) IMPLANT
TAPE FIBER 2MM 7IN #2 BLUE (SUTURE) ×2 IMPLANT
TAPE PAPER 3X10 WHT MICROPORE (GAUZE/BANDAGES/DRESSINGS) ×2 IMPLANT
TOWEL OR 17X24 6PK STRL BLUE (TOWEL DISPOSABLE) ×3 IMPLANT
TUBE CONNECTING 20X1/4 (TUBING) ×3 IMPLANT
TUBING ARTHROSCOPY IRRIG 16FT (MISCELLANEOUS) ×1 IMPLANT
WAND STAR VAC 90 (SURGICAL WAND) ×2 IMPLANT
WATER STERILE IRR 1000ML POUR (IV SOLUTION) ×2 IMPLANT
YANKAUER SUCT BULB TIP NO VENT (SUCTIONS) IMPLANT

## 2011-11-09 NOTE — Discharge Instructions (Signed)

## 2011-11-09 NOTE — Anesthesia Postprocedure Evaluation (Signed)
  Anesthesia Post-op Note  Patient: John Holland  Procedure(s) Performed: Procedure(s) (LRB): SHOULDER ARTHROSCOPY WITH ROTATOR CUFF REPAIR (Right)  Patient Location: PACU  Anesthesia Type: GA combined with regional for post-op pain  Level of Consciousness: awake, alert  and oriented  Airway and Oxygen Therapy: Patient Spontanous Breathing  Post-op Pain: none  Post-op Assessment: Post-op Vital signs reviewed, Patient's Cardiovascular Status Stable, Respiratory Function Stable, Patent Airway, No signs of Nausea or vomiting and Pain level controlled  Post-op Vital Signs: Reviewed and stable  Complications: No apparent anesthesia complications

## 2011-11-09 NOTE — Anesthesia Procedure Notes (Addendum)
Anesthesia Regional Block:  Interscalene brachial plexus block  Pre-Anesthetic Checklist: ,, timeout performed, Correct Patient, Correct Site, Correct Laterality, Correct Procedure, Correct Position, site marked, Risks and benefits discussed, pre-op evaluation,  At surgeon's request and post-op pain management  Laterality: Right  Prep: Maximum Sterile Barrier Precautions used and chloraprep       Needles:  Injection technique: Single-shot  Needle Type: Echogenic Stimulator Needle      Needle Gauge: 22 and 22 G    Additional Needles:  Procedures: ultrasound guided and nerve stimulator Interscalene brachial plexus block  Nerve Stimulator or Paresthesia:  Response: Biceps response, 0.6 mA,   Additional Responses:   Narrative:  Start time: 11/09/2011 10:12 AM End time: 11/09/2011 10:25 AM Injection made incrementally with aspirations every 5 mL. Anesthesiologist: Sampson Goon, MD  Additional Notes: 2% Lidocaine skin wheel.   Interscalene brachial plexus block Procedure Name: Intubation Date/Time: 11/09/2011 1:12 PM Performed by: Gar Gibbon Pre-anesthesia Checklist: Patient identified, Emergency Drugs available, Suction available and Patient being monitored Patient Re-evaluated:Patient Re-evaluated prior to inductionOxygen Delivery Method: Circle System Utilized Preoxygenation: Pre-oxygenation with 100% oxygen Intubation Type: IV induction Ventilation: Mask ventilation without difficulty Laryngoscope Size: Mac and 3 Grade View: Grade I Tube type: Oral Tube size: 8.0 mm Number of attempts: 1 Airway Equipment and Method: stylet and oral airway Placement Confirmation: ETT inserted through vocal cords under direct vision,  positive ETCO2 and breath sounds checked- equal and bilateral Secured at: 21 cm Tube secured with: Tape Dental Injury: Teeth and Oropharynx as per pre-operative assessment

## 2011-11-09 NOTE — Brief Op Note (Signed)
11/09/2011  2:55 PM  PATIENT:  Christene Lye  61 y.o. male  PRE-OPERATIVE DIAGNOSIS:  right shoulder impingement, ac arthrosis, rotator cuff tear two tendon  POST-OPERATIVE DIAGNOSIS:  right shoulder impingement, ac arthrosis, rotator cuff tear two tendon  PROCEDURE:   SHOULDER ARTHROSCOPY,LABRAL DEBRIDEMENT, SUB ACROMIAL DECOMPRESSION AND DISTAL CLAVICLE EXCISION.  ROTATOR CUFF REPAIR HYBRID TWO TENDON DOUBLE ROW REPAIR  SURGEON:  Wyn Forster., MD   PHYSICIAN ASSISTANT:   ASSISTANTS: Mallory Shirk.A-C    ANESTHESIA:   general  EBL:  Total I/O In: 1000 [I.V.:1000] Out: -   BLOOD ADMINISTERED:none  DRAINS: none   LOCAL MEDICATIONS USED:  ROPIVICIANE PLEXUS BLOCK  SPECIMEN:  No Specimen  DISPOSITION OF SPECIMEN:  N/A  COUNTS:  YES  TOURNIQUET:  * No tourniquets in log *  DICTATION: .Other Dictation: Dictation Number [id number]  PLAN OF CARE: Admit for overnight observation  PATIENT DISPOSITION:  PACU - hemodynamically stable.

## 2011-11-09 NOTE — Progress Notes (Signed)
Assisted Dr. Fitzgerald with right, ultrasound guided, supraclavicular block. Side rails up, monitors on throughout procedure. See vital signs in flow sheet. Tolerated Procedure well. 

## 2011-11-09 NOTE — Transfer of Care (Signed)
Immediate Anesthesia Transfer of Care Note  Patient: John Holland  Procedure(s) Performed: Procedure(s) (LRB): SHOULDER ARTHROSCOPY WITH ROTATOR CUFF REPAIR (Right)  Patient Location: PACU  Anesthesia Type: GA combined with regional for post-op pain  Level of Consciousness: sedated and patient cooperative  Airway & Oxygen Therapy: Patient Spontanous Breathing and Patient connected to face mask oxygen  Post-op Assessment: Report given to PACU RN and Post -op Vital signs reviewed and stable  Post vital signs: Reviewed and stable  Complications: No apparent anesthesia complications

## 2011-11-09 NOTE — Op Note (Signed)
[id number] 

## 2011-11-09 NOTE — Anesthesia Preprocedure Evaluation (Signed)
Anesthesia Evaluation  Patient identified by MRN, date of birth, ID band Patient awake    Reviewed: Allergy & Precautions, H&P , NPO status , Patient's Chart, lab work & pertinent test results  Airway Mallampati: II TM Distance: >3 FB Neck ROM: Full    Dental No notable dental hx. (+) Loose, Dental Advisory Given and Teeth Intact   Pulmonary neg pulmonary ROS,  breath sounds clear to auscultation  Pulmonary exam normal       Cardiovascular negative cardio ROS  Rhythm:Regular Rate:Normal     Neuro/Psych PSYCHIATRIC DISORDERS negative neurological ROS     GI/Hepatic negative GI ROS, Neg liver ROS,   Endo/Other  negative endocrine ROS  Renal/GU negative Renal ROS  negative genitourinary   Musculoskeletal   Abdominal   Peds  Hematology negative hematology ROS (+)   Anesthesia Other Findings   Reproductive/Obstetrics negative OB ROS                           Anesthesia Physical Anesthesia Plan  ASA: II  Anesthesia Plan: General   Post-op Pain Management:    Induction: Intravenous  Airway Management Planned: Oral ETT  Additional Equipment:   Intra-op Plan:   Post-operative Plan: Extubation in OR  Informed Consent: I have reviewed the patients History and Physical, chart, labs and discussed the procedure including the risks, benefits and alternatives for the proposed anesthesia with the patient or authorized representative who has indicated his/her understanding and acceptance.   Dental advisory given  Plan Discussed with: CRNA  Anesthesia Plan Comments:         Anesthesia Quick Evaluation

## 2011-11-10 NOTE — Op Note (Signed)
NAME:  John Holland NO.:  [id number]  MEDICAL RECORD NO.:  [id number]  LOCATION:                                 FACILITY:  PHYSICIAN:  John Holland. John Holland, M.D.      DATE OF BIRTH:  DATE OF PROCEDURE:  11/09/2011 DATE OF DISCHARGE:                              OPERATIVE REPORT   PREOPERATIVE DIAGNOSES:  MRI documented 2-tendon retracted rotator cuff tear involving entire supraspinatus and midsubstance tear of infraspinatus with significant retraction and unfavorable acromioclavicular profile due to degenerative arthritis.  POSTOPERATIVE DIAGNOSES: 1. MRI documented 2-tendon retracted rotator cuff tear involving     entire supraspinatus and midsubstance tear of infraspinatus with     significant retraction and unfavorable acromioclavicular profile     due to degenerative arthritis. 2. Labral degenerative changes and type 1 superior labrum anterior and     posterior lesion.  OPERATION: 1. Examination of right shoulder under anesthesia. 2. Diagnostic arthroscopy with arthroscopic debridement of labrum,     SLAP lesion, and rotator cuff tear. 3. Arthroscopic subacromial bursectomy, coracoacromial ligament,     partial relaxation and acromioplasty. 4. Arthroscopic distal clavicle resection. 5. Open hybrid reconstruction of 2-tendon retracted rotator cuff tear,     complete tear of supraspinatus retracted and midsubstance tear of     infraspinatus retracted.  OPERATING SURGEON:  John Holland. John Kaneshiro, MD  ASSISTANT:  John Reeks Dasnoit, PA-C  ANESTHESIA:  General by endotracheal technique supplemented by a right ropivacaine plexus block.  SUPERVISING ANESTHESIOLOGIST:  John Pomfret, MD  INDICATIONS:  John Holland is a 61 year old gentleman referred through the courtesy of Dr. Alonza Holland for evaluation and management of right shoulder pain.  I have followed John Holland for 2 years for painful right shoulder.  We initially advised him that he likely had a  small rotator cuff tear.  He has a very hard working gentleman and elected to continue working until his pain was more problematic.  He returned in 2013 with increasing pain, weakness of scaption and abduction, and requested further treatment of the shoulder.  An MRI of the shoulder documented unfavorable AC anatomy and extensive tendinopathy of the supraspinatus, infraspinatus with a retracted supraspinous rotator cuff tear and a midsubstance supraspinatus rotator cuff tear with medial retraction.  We advised John Holland to undergo diagnostic arthroscopy, subacromial decompression, distal clavicle resection, debridement of the labrum as necessary and told him to anticipate reconstruction of the rotator cuff.  He had some degenerative changes of his superior labrum noted on the MRI.  After informed consent, he is brought to the operating room at this time.  PROCEDURE:  John Holland was brought to room #6 of the [facility] and placed in supine position on the operating table.  John Holland had provided detailed anesthesia and informed consent in the holding area and placed ropivacaine interscalene block with ultrasound control.  In room 6 under Dr. Alroy Holland direct supervision, general endotracheal anesthesia was induced followed by careful positioning of John Holland in the beach-chair position with aid of a torso and head holder designed for shoulder arthroscopy.  The entire right upper extremity and forequarter were prepped with DuraPrep  and draped with impervious arthroscopy drapes.  The shoulder was examined under anesthesia.  No signs of adhesive capsulitis were noted and no signs of significant instability noted.  After a routine surgical time-out, we placed the arthroscope through a standard posterior viewing portal with an anterior switching stick technique.  Diagnostic arthroscopy revealed significant synovitis and a degenerative flap SLAP tear,  type 1.  The subscapularis had minimal degenerative change.  An anterior port was created under direct vision and the 4.2-mm suction shaver was used to debride the labrum, synovitis, and subscapularis.  There was a full-thickness retracted rotator cuff tear note that was documented in the digital camera.  The tear involved the entire footprint of the supraspinatus and the infraspinatus with a midsubstance delamination of the infraspinatus, retracted medially at least 2 cm.  After thorough debridement of the glenohumeral joint, the arthroscope was removed and placed in the subacromial space through posterior portal.  A lateral portal was created and a suction shaver was used to debride bursa.  The coracoacromial ligament was partially relaxed with cutting cautery.  The medial acromion was leveled to a type 1 morphology.  The distal clavicle was examined and found to have severe arthritis at the [facility] joint.  Therefore, the distal centimeter clavicle was removed arthroscopically with a suction bur brought in through the anterior portal.  After hemostasis was achieved with the bipolar cautery, we performed more bursectomy followed by removal of the arthroscope and advancement directly to hybrid repair. A 3-cm anterior middle third deltoid splitting incision was fashioned followed by bursectomy.  There was a bursal membrane covering the retracted tear.  This was resected followed by careful debridement of the tendon to the stable margin.  We performed a double row repair with two medial anchors; posteriorly, a BioComposite corkscrew with FiberTape and anteriorly, a standard swivel lock with #2FiberWire.  A FiberTape was placed in the supraspinatus anteriorly to allow lateralization of the entire rotator cuff.  A double diamondback repair was completed with the FiberTapes and sutures taking care to re-laminate the infraspinatus posteriorly by performing multiple figure-of-eight sutures with  #2 FiberWire.  We were able to advance the infraspinatus and supraspinatus to anterior footprint laterally and remove redundant bursal tissue and the bursal leader.  An anatomic footprint was achieved.  The scope was then replaced in the posterior portal and confirmed that there were no sutures following the long head of the biceps.  After irrigation of the joint and irrigation of the subacromial space, the deltoid split was repaired with simple suture of 0 Vicryl followed by repair of the skin with subcutaneous 3-0 Vicryl and intradermal 3-0 Prolene.  There were no apparent complications.  For aftercare, John Holland will be admitted to recovery care for observation of his vital signs and will be provided antibiotics in form of Ancef 1 g IV q.6 hours x3 doses and appropriate analgesics in the form of p.o. and IV Dilaudid, and possible p.o. ibuprofen.     John Holland, M.D.     RVS/MEDQ  D:  11/09/2011  T:  11/09/2011  Job:  [id number]  cc:   Tinnie Gens A. Tawanna Cooler, MD

## 2011-12-29 ENCOUNTER — Ambulatory Visit (INDEPENDENT_AMBULATORY_CARE_PROVIDER_SITE_OTHER): Payer: BC Managed Care – PPO | Admitting: Family Medicine

## 2011-12-29 ENCOUNTER — Encounter: Payer: Self-pay | Admitting: Family Medicine

## 2011-12-29 VITALS — BP 128/78 | HR 82 | Temp 98.6°F | Wt 146.0 lb

## 2011-12-29 DIAGNOSIS — L02828 Furuncle of other sites: Secondary | ICD-10-CM

## 2011-12-29 DIAGNOSIS — L02821 Furuncle of head [any part, except face]: Secondary | ICD-10-CM

## 2011-12-29 MED ORDER — DOXYCYCLINE HYCLATE 100 MG PO CAPS: 100.0000 mg | ORAL_CAPSULE | Freq: Two times a day (BID) | ORAL | Status: AC

## 2011-12-29 NOTE — Progress Notes (Signed)
  Subjective:    Patient ID: John Holland, male    DOB: 23-Jul-1950, 61 y.o.   MRN: [id number]  HPI Here for 2 days of a painful red bump on the forehead. No recent trauma. No fever. He is recovering from a recent right shoulder surgery.    Review of Systems  Constitutional: Negative.        Objective:   Physical Exam  Constitutional: He appears well-developed and well-nourished.  Skin:       Red, tender nodule in the center of the forehead. No evidence of an insect bite           Assessment & Plan:  Use arm compresses. Recheck prn

## 2012-01-31 ENCOUNTER — Other Ambulatory Visit: Payer: Self-pay | Admitting: Family Medicine

## 2012-05-09 ENCOUNTER — Other Ambulatory Visit: Payer: Self-pay | Admitting: Family Medicine

## 2012-05-10 ENCOUNTER — Telehealth: Payer: Self-pay | Admitting: Family Medicine

## 2012-05-10 MED ORDER — SIMVASTATIN 20 MG PO TABS: 20.0000 mg | ORAL_TABLET | Freq: Every day | ORAL | Status: DC

## 2012-05-10 NOTE — Telephone Encounter (Signed)
Pt has CPE scheduled for 06/09/12 at 12:30 with labs a week prior. He will need one refill of  simvastatin 20 mg. Pt will call pharm w/ that request.

## 2012-06-02 ENCOUNTER — Other Ambulatory Visit (INDEPENDENT_AMBULATORY_CARE_PROVIDER_SITE_OTHER): Payer: BC Managed Care – PPO

## 2012-06-02 DIAGNOSIS — Z0000 Encounter for general adult medical examination without abnormal findings: Secondary | ICD-10-CM

## 2012-06-02 LAB — HEPATIC FUNCTION PANEL
Alkaline Phosphatase: 42 U/L (ref 39–117)
Bilirubin, Direct: 0.1 mg/dL (ref 0.0–0.3)
Total Protein: 6.6 g/dL (ref 6.0–8.3)

## 2012-06-02 LAB — LIPID PANEL
Cholesterol: 141 mg/dL (ref 0–200)
HDL: 40.6 mg/dL (ref >39.00)
Triglycerides: 128 mg/dL (ref 0.0–149.0)
VLDL: 25.6 mg/dL (ref 0.0–40.0)

## 2012-06-02 LAB — CBC WITH DIFFERENTIAL/PLATELET
Basophils Relative: 0.9 % (ref 0.0–3.0)
Eosinophils Absolute: 0.2 10*3/uL (ref 0.0–0.7)
Lymphocytes Relative: 40.6 % (ref 12.0–46.0)
MCHC: 34.1 g/dL (ref 30.0–36.0)
Neutrophils Relative %: 45.8 % (ref 43.0–77.0)
RBC: 4.42 Mil/uL (ref 4.22–5.81)
WBC: 5.1 10*3/uL (ref 4.5–10.5)

## 2012-06-02 LAB — POCT URINALYSIS DIPSTICK
Bilirubin, UA: NEGATIVE
Ketones, UA: NEGATIVE
Leukocytes, UA: NEGATIVE
pH, UA: 5.5

## 2012-06-02 LAB — BASIC METABOLIC PANEL
Calcium: 9.2 mg/dL (ref 8.4–10.5)
Creatinine, Ser: 1.1 mg/dL (ref 0.4–1.5)

## 2012-06-09 ENCOUNTER — Encounter: Payer: Self-pay | Admitting: Family Medicine

## 2012-06-09 ENCOUNTER — Ambulatory Visit (INDEPENDENT_AMBULATORY_CARE_PROVIDER_SITE_OTHER): Payer: BC Managed Care – PPO | Admitting: Family Medicine

## 2012-06-09 VITALS — BP 140/88 | HR 73 | Temp 98.0°F | Ht 69.0 in | Wt 152.0 lb

## 2012-06-09 DIAGNOSIS — E785 Hyperlipidemia, unspecified: Secondary | ICD-10-CM

## 2012-06-09 DIAGNOSIS — F329 Major depressive disorder, single episode, unspecified: Secondary | ICD-10-CM

## 2012-06-09 DIAGNOSIS — Z0000 Encounter for general adult medical examination without abnormal findings: Secondary | ICD-10-CM

## 2012-06-09 DIAGNOSIS — F3289 Other specified depressive episodes: Secondary | ICD-10-CM

## 2012-06-09 MED ORDER — SIMVASTATIN 20 MG PO TABS: 20.0000 mg | ORAL_TABLET | Freq: Every day | ORAL | Status: DC

## 2012-06-09 NOTE — Progress Notes (Signed)
  Subjective:    Patient ID: John Holland, male    DOB: 07/15/1950, 62 y.o.   MRN: [id number]  HPI John Holland is a 62 year old married male ex-smoker who comes in today for general physical examination  He has a history of bipolar type depression and has been treated by Dr. Marina Goodell should John Holland with Valium 5 mg twice daily, Seroquel 200 mg at bedtime and Lamictal 150 mg daily.  He also takes his 20 mg of Zocor and an 81 mg baby aspirin for hyperlipidemia  Gets routine eye care, dental care, recent colonoscopy normal, tetanus 2008, seasonal flu shot today.   Review of Systems  Constitutional: Negative.   HENT: Negative.   Eyes: Negative.   Respiratory: Negative.   Cardiovascular: Negative.   Gastrointestinal: Negative.   Genitourinary: Negative.   Musculoskeletal: Negative.   Skin: Negative.   Neurological: Negative.   Hematological: Negative.   Psychiatric/Behavioral: Negative.        Objective:   Physical Exam  Constitutional: He is oriented to person, place, and time. He appears well-developed and well-nourished.  HENT:  Head: Normocephalic and atraumatic.  Right Ear: External ear normal.  Left Ear: External ear normal.  Nose: Nose normal.  Mouth/Throat: Oropharynx is clear and moist.  Eyes: Conjunctivae normal and EOM are normal. Pupils are equal, round, and reactive to light.  Neck: Normal range of motion. Neck supple. No JVD present. No tracheal deviation present. No thyromegaly present.  Cardiovascular: Normal rate, regular rhythm, normal heart sounds and intact distal pulses.  Exam reveals no gallop and no friction rub.   No murmur heard.      No carotid bruits peripheral pulses 2+ and symmetrical  Pulmonary/Chest: Effort normal and breath sounds normal. No stridor. No respiratory distress. He has no wheezes. He has no rales. He exhibits no tenderness.  Abdominal: Soft. Bowel sounds are normal. He exhibits no distension and no mass. There is no tenderness. There is  no rebound and no guarding.  Genitourinary: Rectum normal, prostate normal and penis normal. Guaiac negative stool. No penile tenderness.  Musculoskeletal: Normal range of motion. He exhibits no edema and no tenderness.  Lymphadenopathy:    He has no cervical adenopathy.  Neurological: He is alert and oriented to person, place, and time. He has normal reflexes. No cranial nerve deficit. He exhibits normal muscle tone.  Skin: Skin is warm and dry. No rash noted. No erythema. No pallor.       Scar right shoulder from previous rotator cuff surgery  Psychiatric: He has a normal mood and affect. His behavior is normal. Judgment and thought content normal.          Assessment & Plan:  Healthy male  Chronic depression continue medication and followup by Dr. Emerson Monte  Hyperlipidemia continue Zocor and aspirin followup in 1 year sooner if any problems

## 2012-06-09 NOTE — Patient Instructions (Signed)
Continue your current medications  Followup in 1 year sooner if any problems 

## 2012-08-19 ENCOUNTER — Ambulatory Visit (INDEPENDENT_AMBULATORY_CARE_PROVIDER_SITE_OTHER): Payer: BC Managed Care – PPO | Admitting: Emergency Medicine

## 2012-08-19 VITALS — BP 94/66 | HR 101 | Temp 97.9°F | Resp 16 | Ht 67.0 in | Wt 145.0 lb

## 2012-08-19 DIAGNOSIS — H1132 Conjunctival hemorrhage, left eye: Secondary | ICD-10-CM

## 2012-08-19 DIAGNOSIS — H1130 Conjunctival hemorrhage, unspecified eye: Secondary | ICD-10-CM

## 2012-08-19 MED ORDER — OFLOXACIN 0.3 % OP SOLN: 1.0000 [drp] | OPHTHALMIC | Status: DC

## 2012-08-19 NOTE — Patient Instructions (Addendum)
Subconjunctival Hemorrhage  A subconjunctival hemorrhage is a bright red patch covering a portion of the white of the eye. The white part of the eye is called the sclera, and it is covered by a thin membrane called the conjunctiva. This membrane is clear, except for tiny blood vessels that you can see with the naked eye. When your eye is irritated or inflamed and becomes red, it is because the vessels in the conjunctiva are swollen.  Sometimes, a blood vessel in the conjunctiva can break and bleed. When this occurs, the blood builds up between the conjunctiva and the sclera, and spreads out to create a red area. The red spot may be very small at first. It may then spread to cover a larger part of the surface of the eye, or even all of the visible white part of the eye.  In almost all cases, the blood will go away and the eye will become white again. Before completely dissolving, however, the red area may spread. It may also become brownish-yellow in color, before going away. If a lot of blood collects under the conjunctiva, it may look like a bulge on the surface of the eye. This looks scary, but it will also eventually flatten out and go away. Subconjunctival hemorrhages do not cause pain, but if swollen, may cause a feeling of irritation. There is no effect on vision.   CAUSES    The most common cause is mild trauma (rubbing the eye, irritation).   Subconjunctival hemorrhages can happen because of coughing or straining (lifting heavy objects), vomiting, or sneezing.   In some cases, your doctor may want to check your blood pressure. High blood pressure can also cause a sunconjunctival hemorrhage.   Severe trauma or blunt injuries.   Diseases that affect blood clotting (hemophilia, leukemia).   Abnormalities of blood vessels behind the eye (carotid cavernous sinus fistula).   Tumors behind the eye.   Certain drugs (aspirin, coumadin, heparin).   Recent eye surgery.  HOME CARE INSTRUCTIONS    Do not worry  about the appearance of your eye. You may continue your usual activities.   Often, follow-up is not necessary.  SEEK MEDICAL CARE IF:    Your eye becomes painful.   The bleeding does not disappear within 3 weeks.   Bleeding occurs elsewhere, for example, under the skin, in the mouth, or in the other eye.   You have recurring subconjunctival hemorrhages.  SEEK IMMEDIATE MEDICAL CARE IF:    Your vision changes or you have difficulty seeing.   You develop severe headache, persistent vomiting, confusion, or abnormal drowsiness (lethargy).   Your eye seems to bulge or protrude from the eye socket.   You notice the sudden appearance of bruises, or have spontaneous bleeding elsewhere on your body.  Document Released: 05/24/2005 Document Revised: 08/16/2011 Document Reviewed: 04/21/2009  ExitCare Patient Information 2013 [employer].

## 2012-08-19 NOTE — Progress Notes (Signed)
[facility] [address], [place] [address] [phone]  Date:  08/19/2012   Name:  John Holland   DOB:  1951-05-10   MRN:  [id number]  PCP:  Evette Georges, MD    Chief Complaint: Eye Problem   History of Present Illness:  John Holland is a 62 y.o. 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  Has a red eye.  No visual complaints.  No pain.  Denies FB sensation.  Denies other complaint or health concern today.  Fluorescein negative   Patient Active Problem List  Diagnosis  . HERPES ZOSTER  . HYPERLIPIDEMIA  . ERECTILE DYSFUNCTION  . DEPRESSION  . OSTEOARTHRITIS    Past Medical History  Diagnosis Date  . Bipolar 1 disorder   . Anxiety   . Depression   . Hyperlipidemia   . Arthritis   . Hyperlipemia     Past Surgical History  Procedure Laterality Date  . Arthroscopy arm  2003    right elbow bone frag  . Ankle fracture surgery      age 32-lt  . Colonoscopy      History  Substance Use Topics  . Smoking status: Current Every Day Smoker    Types: Cigarettes  . Smokeless tobacco: Never Used  . Alcohol Use: No    Family History  Problem Relation Age of Onset  . Colon cancer Neg Hx   . Stomach cancer Neg Hx   . COPD Mother   . Heart disease Mother     Allergies  Allergen Reactions  . Codeine Phosphate Nausea And Vomiting    Medication list has been reviewed and updated.  Current Outpatient Prescriptions on File Prior to Visit  Medication Sig Dispense Refill  . aspirin 81 MG tablet Take 81 mg by mouth daily.      . diazepam (VALIUM) 5 MG tablet Take 5 mg by mouth 3 (three) times daily as needed.       . lamoTRIgine (LAMICTAL) 200 MG tablet Take 150 mg by mouth daily.       . QUEtiapine (SEROQUEL) 100 MG tablet 100 mg at bedtime. Takes 2 tablets at bedtime      . simvastatin (ZOCOR) 20 MG tablet Take 1 tablet  (20 mg total) by mouth at bedtime.  100 tablet  3   No current facility-administered medications on file prior to visit.    Review of Systems:  As per HPI, otherwise negative.    Physical Examination: Filed Vitals:   08/19/12 1309  BP: 94/66  Pulse: 101  Temp: 97.9 F (36.6 C)  Resp: 16   Filed Vitals:   08/19/12 1309  Height: 5\' 7"  (1.702 m)  Weight: 145 lb (65.772 kg)   Body mass index is 22.71 kg/(m^2). Ideal Body Weight: Weight in (lb) to have BMI = 25: 159.3   GEN: WDWN, NAD, Non-toxic, Alert & Oriented x 3 HEENT: Atraumatic, Normocephalic.  PRRERLA EOMI no hyphema.  No FB.   30% subconjunctival hemorrhage medially and inferiorly. Ears and Nose: No external deformity. EXTR: No clubbing/cyanosis/edema NEURO: Normal gait.  PSYCH: Normally interactive. Conversant. Not depressed or anxious appearing.  Calm demeanor.    Assessment and Plan: Subconjunctival hemorrhage ocuflox  Carmelina Dane, MD

## 2013-06-05 ENCOUNTER — Other Ambulatory Visit: Payer: Self-pay | Admitting: Family Medicine

## 2013-07-03 ENCOUNTER — Other Ambulatory Visit: Payer: BC Managed Care – PPO

## 2013-07-10 ENCOUNTER — Encounter: Payer: BC Managed Care – PPO | Admitting: Family Medicine

## 2013-08-01 ENCOUNTER — Ambulatory Visit (INDEPENDENT_AMBULATORY_CARE_PROVIDER_SITE_OTHER): Payer: BC Managed Care – PPO | Admitting: Family

## 2013-08-01 ENCOUNTER — Encounter: Payer: Self-pay | Admitting: Family

## 2013-08-01 VITALS — BP 112/80 | HR 76 | Temp 98.7°F | Wt 145.0 lb

## 2013-08-01 DIAGNOSIS — J029 Acute pharyngitis, unspecified: Secondary | ICD-10-CM

## 2013-08-01 LAB — POCT RAPID STREP A (OFFICE): RAPID STREP A SCREEN: NEGATIVE

## 2013-08-01 MED ORDER — METHYLPREDNISOLONE ACETATE 80 MG/ML IJ SUSP
80.0000 mg | Freq: Once | INTRAMUSCULAR | Status: AC
  Administered 2013-08-01: 80 mg via INTRAMUSCULAR

## 2013-08-01 NOTE — Progress Notes (Signed)
Subjective:    Patient ID: John Holland, male    DOB: 09/15/1950, 63 y.o.   MRN: [id number]  Sore Throat    63 y.o. White male presents to clinic today with chief complaint of a "sore throat". Pt had cough, runny nose and congestion 1 week ago that has cleared up. 3 days ago he developed the sore throat; it is worse in the morning and gets better throughout the day; he is taking lasanges to help relieve the pain; acknowledges difficulty swallowing in the morning; denies difficulty breathing. Denies fever, cough, chills and stomach pain. Acknowledges fatigue.     Review of Systems  Constitutional: Positive for chills and fatigue.  HENT: Positive for sore throat.   Eyes: Negative.   Respiratory: Negative.   Cardiovascular: Negative.   Gastrointestinal: Negative.   Endocrine: Negative.   Genitourinary: Negative.   Musculoskeletal: Negative.   Skin: Negative.   Allergic/Immunologic: Negative.   Neurological: Negative.   Hematological: Negative.   Psychiatric/Behavioral: Negative.    Past Medical History  Diagnosis Date  . Bipolar 1 disorder   . Anxiety   . Depression   . Hyperlipidemia   . Arthritis   . Hyperlipemia     History   Social History  . Marital Status: Married    Spouse Name: N/A    Number of Children: N/A  . Years of Education: N/A   Occupational History  . Material [employer]    Social History Main Topics  . Smoking status: Former Smoker    Types: Cigarettes    Quit date: 03/02/2011  . Smokeless tobacco: Never Used  . Alcohol Use: No  . Drug Use: No  . Sexual Activity: Not on file   Other Topics Concern  . Not on file   Social History Narrative  . No narrative on file    Past Surgical History  Procedure Laterality Date  . Arthroscopy arm  2003    right elbow bone frag  . Ankle fracture surgery      age 46-lt  . Colonoscopy      Family History  Problem Relation Age of Onset  . Colon cancer Neg Hx   . Stomach cancer Neg Hx   . COPD  Mother   . Heart disease Mother     Allergies  Allergen Reactions  . Codeine Phosphate Nausea And Vomiting    Current Outpatient Prescriptions on File Prior to Visit  Medication Sig Dispense Refill  . aspirin 81 MG tablet Take 81 mg by mouth daily.      . diazepam (VALIUM) 5 MG tablet Take 5 mg by mouth 3 (three) times daily as needed.       Marland Kitchen ofloxacin (OCUFLOX) 0.3 % ophthalmic solution Place 1 drop into the left eye every 4 (four) hours.  5 mL  0  . QUEtiapine (SEROQUEL) 100 MG tablet 100 mg at bedtime. Takes 2 tablets at bedtime      . simvastatin (ZOCOR) 20 MG tablet TAKE 1 TABLET EVERY DAY AT BEDTIME  100 tablet  0  . lamoTRIgine (LAMICTAL) 200 MG tablet Take 150 mg by mouth daily.        No current facility-administered medications on file prior to visit.    BP 112/80  Pulse 76  Temp(Src) 98.7 F (37.1 C) (Oral)  Wt 145 lb (65.772 kg)chart    Objective:   Physical Exam  Constitutional: He is oriented to person, place, and time. He appears well-developed and well-nourished. He is active.  HENT:  Head: Normocephalic.  Right Ear: Tympanic membrane, external ear and ear canal normal.  Left Ear: Tympanic membrane, external ear and ear canal normal.  Nose: Nose normal.  Mouth/Throat: Uvula is midline and mucous membranes are normal. Posterior oropharyngeal erythema present.  Cardiovascular: Normal rate, regular rhythm, S1 normal, S2 normal and normal heart sounds.   Pulmonary/Chest: Effort normal and breath sounds normal.  Abdominal: Soft. Normal appearance and bowel sounds are normal.  Neurological: He is alert and oriented to person, place, and time.  Skin: Skin is warm, dry and intact.  Psychiatric: He has a normal mood and affect. His speech is normal and behavior is normal.          Assessment & Plan:  63 y.o. White male with chief complaint of "sore throat".  - Pharyngitis: Rapid Strep test (negative)  - Throat culture  - Ibuprofen 800mg  TID to relieve pain.    - Depomedrol 80mg  SubQ to relieve inflammation and difficulty swallowing.  Education: Warm salt water gurgles to relieve pain.  Follow up: Will contact with results of culture.

## 2013-08-01 NOTE — Progress Notes (Signed)
Pre visit review using our clinic review tool, if applicable. No additional management support is needed unless otherwise documented below in the visit note. 

## 2013-08-01 NOTE — Patient Instructions (Signed)
Pharyngitis °Pharyngitis is redness, pain, and swelling (inflammation) of your pharynx.  °CAUSES  °Pharyngitis is usually caused by infection. Most of the time, these infections are from viruses (viral) and are part of a cold. However, sometimes pharyngitis is caused by bacteria (bacterial). Pharyngitis can also be caused by allergies. Viral pharyngitis may be spread from person to person by coughing, sneezing, and personal items or utensils (cups, forks, spoons, toothbrushes). Bacterial pharyngitis may be spread from person to person by more intimate contact, such as kissing.  °SIGNS AND SYMPTOMS  °Symptoms of pharyngitis include:   °· Sore throat.   °· Tiredness (fatigue).   °· Low-grade fever.   °· Headache. °· Joint pain and muscle aches. °· Skin rashes. °· Swollen lymph nodes. °· Plaque-like film on throat or tonsils (often seen with bacterial pharyngitis). °DIAGNOSIS  °Your health care provider will ask you questions about your illness and your symptoms. Your medical history, along with a physical exam, is often all that is needed to diagnose pharyngitis. Sometimes, a rapid strep test is done. Other lab tests may also be done, depending on the suspected cause.  °TREATMENT  °Viral pharyngitis will usually get better in 3 4 days without the use of medicine. Bacterial pharyngitis is treated with medicines that kill germs (antibiotics).  °HOME CARE INSTRUCTIONS  °· Drink enough water and fluids to keep your urine clear or pale yellow.   °· Only take over-the-counter or prescription medicines as directed by your health care provider:   °· If you are prescribed antibiotics, make sure you finish them even if you start to feel better.   °· Do not take aspirin.   °· Get lots of rest.   °· Gargle with 8 oz of salt water (½ tsp of salt per 1 qt of water) as often as every 1 2 hours to soothe your throat.   °· Throat lozenges (if you are not at risk for choking) or sprays may be used to soothe your throat. °SEEK MEDICAL  CARE IF:  °· You have large, tender lumps in your neck. °· You have a rash. °· You cough up green, yellow-brown, or bloody spit. °SEEK IMMEDIATE MEDICAL CARE IF:  °· Your neck becomes stiff. °· You drool or are unable to swallow liquids. °· You vomit or are unable to keep medicines or liquids down. °· You have severe pain that does not go away with the use of recommended medicines. °· You have trouble breathing (not caused by a stuffy nose). °MAKE SURE YOU:  °· Understand these instructions. °· Will watch your condition. °· Will get help right away if you are not doing well or get worse. °Document Released: 05/24/2005 Document Revised: 03/14/2013 Document Reviewed: 01/29/2013 °[employer]® Patient Information ©2014 [employer]. ° °

## 2013-08-03 ENCOUNTER — Other Ambulatory Visit: Payer: Self-pay | Admitting: Family

## 2013-08-03 LAB — CULTURE, GROUP A STREP

## 2013-08-03 MED ORDER — AMOXICILLIN-POT CLAVULANATE 875-125 MG PO TABS: 1.0000 | ORAL_TABLET | Freq: Two times a day (BID) | ORAL | Status: DC

## 2013-08-07 ENCOUNTER — Other Ambulatory Visit: Payer: BC Managed Care – PPO

## 2013-08-09 ENCOUNTER — Other Ambulatory Visit (INDEPENDENT_AMBULATORY_CARE_PROVIDER_SITE_OTHER): Payer: BC Managed Care – PPO

## 2013-08-09 DIAGNOSIS — Z0000 Encounter for general adult medical examination without abnormal findings: Secondary | ICD-10-CM

## 2013-08-09 LAB — CBC WITH DIFFERENTIAL/PLATELET
BASOS ABS: 0.1 10*3/uL (ref 0.0–0.1)
Basophils Relative: 1 % (ref 0.0–3.0)
EOS ABS: 0.1 10*3/uL (ref 0.0–0.7)
Eosinophils Relative: 1.3 % (ref 0.0–5.0)
HCT: 43 % (ref 39.0–52.0)
HEMOGLOBIN: 14.7 g/dL (ref 13.0–17.0)
LYMPHS PCT: 31.3 % (ref 12.0–46.0)
Lymphs Abs: 2 10*3/uL (ref 0.7–4.0)
MCHC: 34.2 g/dL (ref 30.0–36.0)
MCV: 93.4 fl (ref 78.0–100.0)
MONOS PCT: 8.2 % (ref 3.0–12.0)
Monocytes Absolute: 0.5 10*3/uL (ref 0.1–1.0)
NEUTROS ABS: 3.7 10*3/uL (ref 1.4–7.7)
NEUTROS PCT: 58.2 % (ref 43.0–77.0)
PLATELETS: 313 10*3/uL (ref 150.0–400.0)
RBC: 4.6 Mil/uL (ref 4.22–5.81)
RDW: 12.1 % (ref 11.5–14.6)
WBC: 6.4 10*3/uL (ref 4.5–10.5)

## 2013-08-09 LAB — LIPID PANEL
CHOL/HDL RATIO: 3
CHOLESTEROL: 142 mg/dL (ref 0–200)
HDL: 42.8 mg/dL (ref >39.00)
LDL CALC: 88 mg/dL (ref 0–99)
TRIGLYCERIDES: 55 mg/dL (ref 0.0–149.0)
VLDL: 11 mg/dL (ref 0.0–40.0)

## 2013-08-09 LAB — POCT URINALYSIS DIPSTICK
Bilirubin, UA: NEGATIVE
GLUCOSE UA: NEGATIVE
KETONES UA: NEGATIVE
Leukocytes, UA: NEGATIVE
Nitrite, UA: NEGATIVE
Protein, UA: NEGATIVE
RBC UA: NEGATIVE
SPEC GRAV UA: 1.015
Urobilinogen, UA: 0.2
pH, UA: 5.5

## 2013-08-09 LAB — BASIC METABOLIC PANEL
BUN: 15 mg/dL (ref 6–23)
CHLORIDE: 107 meq/L (ref 96–112)
CO2: 23 mEq/L (ref 19–32)
Calcium: 9.2 mg/dL (ref 8.4–10.5)
Creatinine, Ser: 1 mg/dL (ref 0.4–1.5)
GFR: 85.01 mL/min (ref >60.00)
Glucose, Bld: 93 mg/dL (ref 70–99)
POTASSIUM: 4.1 meq/L (ref 3.5–5.1)
Sodium: 136 mEq/L (ref 135–145)

## 2013-08-09 LAB — HEPATIC FUNCTION PANEL
ALBUMIN: 4 g/dL (ref 3.5–5.2)
ALK PHOS: 53 U/L (ref 39–117)
ALT: 43 U/L (ref 0–53)
AST: 38 U/L — AB (ref 0–37)
BILIRUBIN DIRECT: 0.2 mg/dL (ref 0.0–0.3)
BILIRUBIN TOTAL: 0.9 mg/dL (ref 0.3–1.2)
Total Protein: 7.1 g/dL (ref 6.0–8.3)

## 2013-08-09 LAB — TSH: TSH: 6.24 u[IU]/mL — ABNORMAL HIGH (ref 0.35–5.50)

## 2013-08-09 LAB — PSA: PSA: 1.46 ng/mL (ref 0.10–4.00)

## 2013-08-14 ENCOUNTER — Encounter: Payer: Self-pay | Admitting: Family Medicine

## 2013-08-14 ENCOUNTER — Ambulatory Visit (INDEPENDENT_AMBULATORY_CARE_PROVIDER_SITE_OTHER): Payer: BC Managed Care – PPO | Admitting: Family Medicine

## 2013-08-14 VITALS — BP 130/90 | Temp 98.5°F | Ht 68.0 in | Wt 150.0 lb

## 2013-08-14 DIAGNOSIS — R899 Unspecified abnormal finding in specimens from other organs, systems and tissues: Secondary | ICD-10-CM

## 2013-08-14 DIAGNOSIS — R6889 Other general symptoms and signs: Secondary | ICD-10-CM

## 2013-08-14 DIAGNOSIS — F528 Other sexual dysfunction not due to a substance or known physiological condition: Secondary | ICD-10-CM

## 2013-08-14 DIAGNOSIS — F17200 Nicotine dependence, unspecified, uncomplicated: Secondary | ICD-10-CM

## 2013-08-14 DIAGNOSIS — E785 Hyperlipidemia, unspecified: Secondary | ICD-10-CM

## 2013-08-14 MED ORDER — SIMVASTATIN 20 MG PO TABS: ORAL_TABLET | ORAL | Status: DC

## 2013-08-14 MED ORDER — SILDENAFIL CITRATE 100 MG PO TABS: 50.0000 mg | ORAL_TABLET | Freq: Every day | ORAL | Status: DC | PRN

## 2013-08-14 NOTE — Progress Notes (Signed)
Pre visit review using our clinic review tool, if applicable. No additional management support is needed unless otherwise documented below in the visit note. 

## 2013-08-14 NOTE — Progress Notes (Signed)
   Subjective:    Patient ID: John Holland, male    DOB: 1951/05/20, 63 y.o.   MRN: [id number]  HPI Edd Arbour is a 63 year old married male nonsmoker ......... x3 years.......... who comes in today for general physical examination  He said he was seen you recently and had a strep test was negative culture then was done and he was given Augmentin 875 mg twice a day.   He takes Valium 5 mg 3 times daily and Seroquel from his psychiatrist Dr. Caprice Beaver  He takes aspirin and a 20 mg Zocor daily for hyperlipidemia  He gets routine eye care, dental care, vaccinations up-to-date, colonoscopy and GI   Review of Systems  Constitutional: Negative.   HENT: Negative.   Eyes: Negative.   Respiratory: Negative.   Cardiovascular: Negative.   Gastrointestinal: Negative.   Endocrine: Negative.   Genitourinary: Negative.   Musculoskeletal: Negative.   Skin: Negative.   Allergic/Immunologic: Negative.   Neurological: Negative.   Hematological: Negative.   Psychiatric/Behavioral: Negative.        Objective:   Physical Exam  Nursing note and vitals reviewed. Constitutional: He is oriented to person, place, and time. He appears well-developed and well-nourished.  HENT:  Head: Normocephalic and atraumatic.  Right Ear: External ear normal.  Left Ear: External ear normal.  Nose: Nose normal.  Mouth/Throat: Oropharynx is clear and moist.  Eyes: Conjunctivae and EOM are normal. Pupils are equal, round, and reactive to light.  Neck: Normal range of motion. Neck supple. No JVD present. No tracheal deviation present. No thyromegaly present.  Cardiovascular: Normal rate, regular rhythm, normal heart sounds and intact distal pulses.  Exam reveals no gallop and no friction rub.   No murmur heard. No carotid aortic bruits peripheral pulses 2+ and symmetrical  Pulmonary/Chest: Effort normal and breath sounds normal. No stridor. No respiratory distress. He has no wheezes. He has no rales. He exhibits no  tenderness.  Abdominal: Soft. Bowel sounds are normal. He exhibits no distension and no mass. There is no tenderness. There is no rebound and no guarding.  Genitourinary: Rectum normal and penis normal. Guaiac negative stool. No penile tenderness.  2+ symmetrical BPH  Musculoskeletal: Normal range of motion. He exhibits no edema and no tenderness.  Lymphadenopathy:    He has no cervical adenopathy.  Neurological: He is alert and oriented to person, place, and time. He has normal reflexes. No cranial nerve deficit. He exhibits normal muscle tone.  Skin: Skin is warm and dry. No rash noted. No erythema. No pallor.  Psychiatric: He has a normal mood and affect. His behavior is normal. Judgment and thought content normal.          Assessment & Plan:  Healthy male  Hyperlipidemia continue Zocor and aspirin  Depression continue Seroquel and Valium followed by Dr. Caprice Beaver  Erectile dysfunction Viagra when necessary

## 2013-08-14 NOTE — Patient Instructions (Signed)
Continue the aspirin and Zocor......... one of each daily  Viagra 100 mg....... one half or one quarter tablet when necessary.......... [email or web address]  Return in one year sooner if any problems

## 2013-09-14 ENCOUNTER — Other Ambulatory Visit (INDEPENDENT_AMBULATORY_CARE_PROVIDER_SITE_OTHER): Payer: BC Managed Care – PPO

## 2013-09-14 DIAGNOSIS — R6889 Other general symptoms and signs: Secondary | ICD-10-CM

## 2013-09-14 DIAGNOSIS — R899 Unspecified abnormal finding in specimens from other organs, systems and tissues: Secondary | ICD-10-CM

## 2013-09-14 LAB — TSH: TSH: 0.55 u[IU]/mL (ref 0.35–5.50)

## 2014-06-03 ENCOUNTER — Other Ambulatory Visit: Payer: Self-pay | Admitting: Family Medicine

## 2014-08-15 ENCOUNTER — Other Ambulatory Visit (INDEPENDENT_AMBULATORY_CARE_PROVIDER_SITE_OTHER): Payer: BLUE CROSS/BLUE SHIELD

## 2014-08-15 DIAGNOSIS — Z0000 Encounter for general adult medical examination without abnormal findings: Secondary | ICD-10-CM

## 2014-08-15 LAB — LIPID PANEL
Cholesterol: 157 mg/dL (ref 0–200)
HDL: 41.9 mg/dL (ref >39.00)
LDL Cholesterol: 86 mg/dL (ref 0–99)
NonHDL: 115.1
Total CHOL/HDL Ratio: 4
Triglycerides: 144 mg/dL (ref 0.0–149.0)
VLDL: 28.8 mg/dL (ref 0.0–40.0)

## 2014-08-15 LAB — POCT URINALYSIS DIPSTICK
BILIRUBIN UA: NEGATIVE
Blood, UA: NEGATIVE
Glucose, UA: NEGATIVE
KETONES UA: NEGATIVE
LEUKOCYTES UA: NEGATIVE
Nitrite, UA: NEGATIVE
PH UA: 6
Protein, UA: NEGATIVE
Spec Grav, UA: <=1.005
Urobilinogen, UA: 0.2

## 2014-08-15 LAB — COMPREHENSIVE METABOLIC PANEL
ALK PHOS: 44 U/L (ref 39–117)
ALT: 63 U/L — ABNORMAL HIGH (ref 0–53)
AST: 50 U/L — ABNORMAL HIGH (ref 0–37)
Albumin: 4.6 g/dL (ref 3.5–5.2)
BILIRUBIN TOTAL: 0.5 mg/dL (ref 0.2–1.2)
BUN: 21 mg/dL (ref 6–23)
CHLORIDE: 107 meq/L (ref 96–112)
CO2: 28 meq/L (ref 19–32)
CREATININE: 1.08 mg/dL (ref 0.40–1.50)
Calcium: 9.6 mg/dL (ref 8.4–10.5)
GFR: 73.08 mL/min (ref >60.00)
GLUCOSE: 99 mg/dL (ref 70–99)
Potassium: 4.7 mEq/L (ref 3.5–5.1)
Sodium: 140 mEq/L (ref 135–145)
TOTAL PROTEIN: 7.3 g/dL (ref 6.0–8.3)

## 2014-08-15 LAB — CBC WITH DIFFERENTIAL/PLATELET
BASOS PCT: 1.1 % (ref 0.0–3.0)
Basophils Absolute: 0.1 10*3/uL (ref 0.0–0.1)
EOS ABS: 0.1 10*3/uL (ref 0.0–0.7)
Eosinophils Relative: 1.6 % (ref 0.0–5.0)
HCT: 43.4 % (ref 39.0–52.0)
Hemoglobin: 15.2 g/dL (ref 13.0–17.0)
LYMPHS PCT: 33.5 % (ref 12.0–46.0)
Lymphs Abs: 2 10*3/uL (ref 0.7–4.0)
MCHC: 35 g/dL (ref 30.0–36.0)
MCV: 92.3 fl (ref 78.0–100.0)
MONO ABS: 0.5 10*3/uL (ref 0.1–1.0)
MONOS PCT: 8.6 % (ref 3.0–12.0)
Neutro Abs: 3.3 10*3/uL (ref 1.4–7.7)
Neutrophils Relative %: 55.2 % (ref 43.0–77.0)
Platelets: 183 10*3/uL (ref 150.0–400.0)
RBC: 4.7 Mil/uL (ref 4.22–5.81)
RDW: 12.9 % (ref 11.5–15.5)
WBC: 5.9 10*3/uL (ref 4.0–10.5)

## 2014-08-16 LAB — PSA: PSA: 1.15 ng/mL (ref 0.10–4.00)

## 2014-08-16 LAB — TSH: TSH: 1.02 u[IU]/mL (ref 0.35–4.50)

## 2014-08-22 ENCOUNTER — Ambulatory Visit (INDEPENDENT_AMBULATORY_CARE_PROVIDER_SITE_OTHER): Payer: BLUE CROSS/BLUE SHIELD | Admitting: Family Medicine

## 2014-08-22 ENCOUNTER — Encounter: Payer: Self-pay | Admitting: Family Medicine

## 2014-08-22 VITALS — BP 120/80 | Temp 98.7°F | Ht 68.0 in | Wt 152.0 lb

## 2014-08-22 DIAGNOSIS — Z0000 Encounter for general adult medical examination without abnormal findings: Secondary | ICD-10-CM

## 2014-08-22 DIAGNOSIS — E785 Hyperlipidemia, unspecified: Secondary | ICD-10-CM

## 2014-08-22 DIAGNOSIS — F418 Other specified anxiety disorders: Secondary | ICD-10-CM | POA: Diagnosis not present

## 2014-08-22 MED ORDER — SIMVASTATIN 20 MG PO TABS: ORAL_TABLET | ORAL | Status: DC

## 2014-08-22 MED ORDER — QUETIAPINE FUMARATE 100 MG PO TABS: ORAL_TABLET | ORAL | Status: DC

## 2014-08-22 MED ORDER — DIAZEPAM 5 MG PO TABS: 5.0000 mg | ORAL_TABLET | Freq: Three times a day (TID) | ORAL | Status: DC | PRN

## 2014-08-22 NOTE — Progress Notes (Signed)
Pre visit review using our clinic review tool, if applicable. No additional management support is needed unless otherwise documented below in the visit note. 

## 2014-08-22 NOTE — Progress Notes (Signed)
   Subjective:    Patient ID: John Holland, male    DOB: 1951/03/26, 64 y.o.   MRN: [id number]  HPI John Holland is a 64 year old married male nonsmoker who comes in today for general physical examination  He has a long-standing history of depression been treated by Dr. Caprice Beaver. She will not be able to see him because he cannot turn 64. Since he stable we will rewrite his medication. He takes 200 mg of Seroquel bedtime and Valium 5 mg 3 times a day when necessary  He also uses Zocor and an aspirin tablet daily for hyperlipidemia  He gets routine eye care, dental care, colonoscopy 2015 normal, vaccinations up-to-date  New problem is tinnitus which is getting worse. It was in his right ear now it's in his left. 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   HENT: Negative.   Eyes: Negative.   Respiratory: Negative.   Cardiovascular: Negative.   Gastrointestinal: Negative.   Endocrine: Negative.   Genitourinary: Negative.   Musculoskeletal: Negative.   Skin: Negative.   Allergic/Immunologic: Negative.   Neurological: Negative.   Hematological: Negative.   Psychiatric/Behavioral: Negative.        Objective:   Physical Exam  Constitutional: He is oriented to person, place, and time. He appears well-developed and well-nourished.  HENT:  Head: Normocephalic and atraumatic.  Right Ear: External ear normal.  Left Ear: External ear normal.  Nose: Nose normal.  Mouth/Throat: Oropharynx is clear and moist.  Eyes: Conjunctivae and EOM are normal. Pupils are equal, round, and reactive to light.  Neck: Normal range of motion. Neck supple. No JVD present. No tracheal deviation present. No thyromegaly present.  Cardiovascular: Normal rate, regular rhythm, normal heart sounds and intact distal pulses.  Exam reveals no gallop and no friction rub.   No murmur heard. Pulmonary/Chest:  Effort normal and breath sounds normal. No stridor. No respiratory distress. He has no wheezes. He has no rales. He exhibits no tenderness.  Abdominal: Soft. Bowel sounds are normal. He exhibits no distension and no mass. There is no tenderness. There is no rebound and no guarding.  Genitourinary: Rectum normal, prostate normal and penis normal. Guaiac negative stool. No penile tenderness.  Musculoskeletal: Normal range of motion. He exhibits no edema or tenderness.  Lymphadenopathy:    He has no cervical adenopathy.  Neurological: He is alert and oriented to person, place, and time. He has normal reflexes. No cranial nerve deficit. He exhibits normal muscle tone.  Skin: Skin is warm and dry. No rash noted. No erythema. No pallor.  Psychiatric: He has a normal mood and affect. His behavior is normal. Judgment and thought content normal.  Nursing note and vitals reviewed.         Assessment & Plan:  Healthy male  History of depression continue Seroquel and Valium  History of hyperlipidemia continue Zocor and aspirin  Erectile dysfunction declines Viagra  Tinnitus

## 2014-08-22 NOTE — Patient Instructions (Signed)
Continue current medications  Follow-up in one year sooner if any problem

## 2015-03-12 DIAGNOSIS — H02834 Dermatochalasis of left upper eyelid: Secondary | ICD-10-CM | POA: Diagnosis not present

## 2015-03-12 DIAGNOSIS — H2511 Age-related nuclear cataract, right eye: Secondary | ICD-10-CM | POA: Diagnosis not present

## 2015-03-12 DIAGNOSIS — D3131 Benign neoplasm of right choroid: Secondary | ICD-10-CM | POA: Diagnosis not present

## 2015-03-12 DIAGNOSIS — H2512 Age-related nuclear cataract, left eye: Secondary | ICD-10-CM | POA: Diagnosis not present

## 2015-07-03 ENCOUNTER — Encounter: Payer: Self-pay | Admitting: Adult Health

## 2015-07-03 ENCOUNTER — Ambulatory Visit (INDEPENDENT_AMBULATORY_CARE_PROVIDER_SITE_OTHER): Payer: Medicare Other | Admitting: Adult Health

## 2015-07-03 VITALS — BP 132/80 | Temp 98.7°F | Ht 68.0 in | Wt 145.8 lb

## 2015-07-03 DIAGNOSIS — H6591 Unspecified nonsuppurative otitis media, right ear: Secondary | ICD-10-CM | POA: Diagnosis not present

## 2015-07-03 DIAGNOSIS — H60391 Other infective otitis externa, right ear: Secondary | ICD-10-CM | POA: Diagnosis not present

## 2015-07-03 MED ORDER — AMOXICILLIN 875 MG PO TABS: 875.0000 mg | ORAL_TABLET | Freq: Two times a day (BID) | ORAL | Status: DC

## 2015-07-03 MED ORDER — NEOMYCIN-POLYMYXIN-HC 3.5-10000-1 OT SOLN: 3.0000 [drp] | Freq: Three times a day (TID) | OTIC | Status: DC

## 2015-07-03 NOTE — Patient Instructions (Addendum)
It was great meeting you today!  I have sent in a prescription for Amoxicillin, take this twice a day for 10 days.   I have also sent in a prescription for a antibiotic ear drop. Please place three drops in the right ear, three times a day for the next 4 days.   Follow up as needed

## 2015-07-03 NOTE — Progress Notes (Signed)
   Subjective:    Patient ID: John Holland, male    DOB: Oct 19, 1950, 65 y.o.   MRN: [id number]  HPI  65 year old male who present to the office today for for an acute issue of ear pain in the right ear. He endorses itching " bothersome" for the last week. When he woke up this morning he had radiating pain described as " being smacked in the jaw." He also endorses " I can hear fluid behind my ear"   He denies any drainage or fevers.   Review of Systems  Constitutional: Negative.   HENT: Positive for ear pain. Negative for congestion, dental problem and ear discharge.   Neurological: Negative.   All other systems reviewed and are negative.      Objective:   Physical Exam  Constitutional: He is oriented to person, place, and time. He appears well-developed and well-nourished. No distress.  HENT:  Head: Normocephalic and atraumatic.  Nose: Nose normal.  Mouth/Throat: Oropharynx is clear and moist. No oropharyngeal exudate.  Otitis externa and otitis media with effusion to right ear. No drainage noted. Painful inner ear canal with exam.   Neck: Neck supple.  Cardiovascular: Normal rate, regular rhythm, normal heart sounds and intact distal pulses.  Exam reveals no gallop and no friction rub.   No murmur heard. Pulmonary/Chest: Effort normal and breath sounds normal. No respiratory distress. He has no wheezes. He has no rales. He exhibits no tenderness.  Lymphadenopathy:    He has no cervical adenopathy.  Neurological: He is alert and oriented to person, place, and time.  Skin: Skin is warm and dry. No rash noted. He is not diaphoretic. No erythema. No pallor.  Psychiatric: He has a normal mood and affect. His behavior is normal. Judgment and thought content normal.  Nursing note and vitals reviewed.     Assessment & Plan:  1. Otitis media with effusion, right - amoxicillin (AMOXIL) 875 MG tablet; Take 1 tablet (875 mg total) by mouth 2 (two) times daily.  Dispense: 20 tablet;  Refill: 0 - Follow up as needed.  2. Otitis, externa, infective, right  - neomycin-polymyxin-hydrocortisone (CORTISPORIN) otic solution; Place 3 drops into the right ear 3 (three) times daily.  Dispense: 10 mL; Refill: 1

## 2015-07-03 NOTE — Progress Notes (Signed)
Pre visit review using our clinic review tool, if applicable. No additional management support is needed unless otherwise documented below in the visit note. 

## 2015-09-11 ENCOUNTER — Other Ambulatory Visit: Payer: Self-pay | Admitting: Family Medicine

## 2015-09-23 ENCOUNTER — Other Ambulatory Visit: Payer: Self-pay | Admitting: Family Medicine

## 2015-09-27 ENCOUNTER — Other Ambulatory Visit: Payer: Self-pay | Admitting: Family Medicine

## 2015-10-17 ENCOUNTER — Encounter: Payer: Self-pay | Admitting: Adult Health

## 2015-10-17 ENCOUNTER — Ambulatory Visit (INDEPENDENT_AMBULATORY_CARE_PROVIDER_SITE_OTHER): Payer: Medicare Other | Admitting: Adult Health

## 2015-10-17 VITALS — BP 134/70 | Temp 98.3°F | Ht 68.0 in | Wt 148.2 lb

## 2015-10-17 DIAGNOSIS — F329 Major depressive disorder, single episode, unspecified: Secondary | ICD-10-CM | POA: Diagnosis not present

## 2015-10-17 DIAGNOSIS — Z7689 Persons encountering health services in other specified circumstances: Secondary | ICD-10-CM

## 2015-10-17 DIAGNOSIS — Z23 Encounter for immunization: Secondary | ICD-10-CM

## 2015-10-17 DIAGNOSIS — Z7189 Other specified counseling: Secondary | ICD-10-CM | POA: Diagnosis not present

## 2015-10-17 DIAGNOSIS — F32A Depression, unspecified: Secondary | ICD-10-CM

## 2015-10-17 NOTE — Patient Instructions (Addendum)
It was great seeing you again today!  Please follow up with summer for your physical.   If you need anything in the meantime, please let me know   Health Maintenance, Male A healthy lifestyle and preventative care can promote health and wellness.  Maintain regular health, dental, and eye exams.  Eat a healthy diet. Foods like vegetables, fruits, whole grains, low-fat dairy products, and lean protein foods contain the nutrients you need and are low in calories. Decrease your intake of foods high in solid fats, added sugars, and salt. Get information about a proper diet from your health care provider, if necessary.  Regular physical exercise is one of the most important things you can do for your health. Most adults should get at least 150 minutes of moderate-intensity exercise (any activity that increases your heart rate and causes you to sweat) each week. In addition, most adults need muscle-strengthening exercises on 2 or more days a week.   Maintain a healthy weight. The body mass index (BMI) is a screening tool to identify possible weight problems. It provides an estimate of body fat based on height and weight. Your health care provider can find your BMI and can help you achieve or maintain a healthy weight. For males 20 years and older:  A BMI below 18.5 is considered underweight.  A BMI of 18.5 to 24.9 is normal.  A BMI of 25 to 29.9 is considered overweight.  A BMI of 30 and above is considered obese.  Maintain normal blood lipids and cholesterol by exercising and minimizing your intake of saturated fat. Eat a balanced diet with plenty of fruits and vegetables. Blood tests for lipids and cholesterol should begin at age 54 and be repeated every 5 years. If your lipid or cholesterol levels are high, you are over age 58, or you are at high risk for heart disease, you may need your cholesterol levels checked more frequently.Ongoing high lipid and cholesterol levels should be treated with  medicines if diet and exercise are not working.  If you smoke, find out from your health care provider how to quit. If you do not use tobacco, do not start.  Lung cancer screening is recommended for adults aged 64-80 years who are at high risk for developing lung cancer because of a history of smoking. A yearly low-dose CT scan of the lungs is recommended for people who have at least a 30-pack-year history of smoking and are current smokers or have quit within the past 15 years. A pack year of smoking is smoking an average of 1 pack of cigarettes a day for 1 year (for example, a 30-pack-year history of smoking could mean smoking 1 pack a day for 30 years or 2 packs a day for 15 years). Yearly screening should continue until the smoker has stopped smoking for at least 15 years. Yearly screening should be stopped for people who develop a health problem that would prevent them from having lung cancer treatment.  If you choose to drink alcohol, do not have more than 2 drinks per day. One drink is considered to be 12 oz (360 mL) of beer, 5 oz (150 mL) of wine, or 1.5 oz (45 mL) of liquor.  Avoid the use of street drugs. Do not share needles with anyone. Ask for help if you need support or instructions about stopping the use of drugs.  High blood pressure causes heart disease and increases the risk of stroke. High blood pressure is more likely to develop  in:  People who have blood pressure in the end of the normal range (100-139/85-89 mm Hg).  People who are overweight or obese.  People who are African American.  If you are 76-70 years of age, have your blood pressure checked every 3-5 years. If you are 36 years of age or older, have your blood pressure checked every year. You should have your blood pressure measured twice--once when you are at a hospital or clinic, and once when you are not at a hospital or clinic. Record the average of the two measurements. To check your blood pressure when you are not  at a hospital or clinic, you can use:  An automated blood pressure machine at a pharmacy.  A home blood pressure monitor.  If you are 52-23 years old, ask your health care provider if you should take aspirin to prevent heart disease.  Diabetes screening involves taking a blood sample to check your fasting blood sugar level. This should be done once every 3 years after age 74 if you are at a normal weight and without risk factors for diabetes. Testing should be considered at a younger age or be carried out more frequently if you are overweight and have at least 1 risk factor for diabetes.  Colorectal cancer can be detected and often prevented. Most routine colorectal cancer screening begins at the age of 26 and continues through age 88. However, your health care provider may recommend screening at an earlier age if you have risk factors for colon cancer. On a yearly basis, your health care provider may provide home test kits to check for hidden blood in the stool. A small camera at the end of a tube may be used to directly examine the colon (sigmoidoscopy or colonoscopy) to detect the earliest forms of colorectal cancer. Talk to your health care provider about this at age 5 when routine screening begins. A direct exam of the colon should be repeated every 5-10 years through age 75, unless early forms of precancerous polyps or small growths are found.  People who are at an increased risk for hepatitis B should be screened for this virus. You are considered at high risk for hepatitis B if:  You were born in a country where hepatitis B occurs often. Talk with your health care provider about which countries are considered high risk.  Your parents were born in a high-risk country and you have not received a shot to protect against hepatitis B (hepatitis B vaccine).  You have HIV or AIDS.  You use needles to inject street drugs.  You live with, or have sex with, someone who has hepatitis B.  You  are a man who has sex with other men (MSM).  You get hemodialysis treatment.  You take certain medicines for conditions like cancer, organ transplantation, and autoimmune conditions.  Hepatitis C blood testing is recommended for all people born from [birth year] through 1965 and any individual with known risk factors for hepatitis C.  Healthy men should no longer receive prostate-specific antigen (PSA) blood tests as part of routine cancer screening. Talk to your health care provider about prostate cancer screening.  Testicular cancer screening is not recommended for adolescents or adult males who have no symptoms. Screening includes self-exam, a health care provider exam, and other screening tests. Consult with your health care provider about any symptoms you have or any concerns you have about testicular cancer.  Practice safe sex. Use condoms and avoid high-risk sexual practices to reduce the  spread of sexually transmitted infections (STIs).  You should be screened for STIs, including gonorrhea and chlamydia if:  You are sexually active and are younger than 24 years.  You are older than 24 years, and your health care provider tells you that you are at risk for this type of infection.  Your sexual activity has changed since you were last screened, and you are at an increased risk for chlamydia or gonorrhea. Ask your health care provider if you are at risk.  If you are at risk of being infected with HIV, it is recommended that you take a prescription medicine daily to prevent HIV infection. This is called pre-exposure prophylaxis (PrEP). You are considered at risk if:  You are a man who has sex with other men (MSM).  You are a heterosexual man who is sexually active with multiple partners.  You take drugs by injection.  You are sexually active with a partner who has HIV.  Talk with your health care provider about whether you are at high risk of being infected with HIV. If you choose to begin  PrEP, you should first be tested for HIV. You should then be tested every 3 months for as long as you are taking PrEP.  Use sunscreen. Apply sunscreen liberally and repeatedly throughout the day. You should seek shade when your shadow is shorter than you. Protect yourself by wearing long sleeves, pants, a wide-brimmed hat, and sunglasses year round whenever you are outdoors.  Tell your health care provider of new moles or changes in moles, especially if there is a change in shape or color. Also, tell your health care provider if a mole is larger than the size of a pencil eraser.  A one-time screening for abdominal aortic aneurysm (AAA) and surgical repair of large AAAs by ultrasound is recommended for men aged 41-75 years who are current or former smokers.  Stay current with your vaccines (immunizations).   This information is not intended to replace advice given to you by your health care provider. Make sure you discuss any questions you have with your health care provider.   Document Released: 11/20/2007 Document Revised: 06/14/2014 Document Reviewed: 10/19/2010 Elsevier Interactive Patient Education [employer].

## 2015-10-17 NOTE — Progress Notes (Signed)
Patient presents to clinic today to establish care. He is a pleasant caucasian male who  has a past medical history of Bipolar 1 disorder ([place]); Anxiety; Depression; Hyperlipidemia; and Arthritis.   His last physical was in March 2016 with MD Sherren Mocha   Acute Concerns: Establish Care  Chronic Issues: Depression/Anxiety/BiPolar  - He is weaning himself off Seroquel, is currently at 100mg  . He was placed on it for sleep by Dr. Caprice Beaver. He would like to get back to " normal sleep". He denies having any Manic issues. Does have anger issues. He no longer seeing Dr. Caprice Beaver  - He takes two valium a day.    Health Maintenance: Dental -- Twice a year with Dr. Derenda Mis  Vision -- Every other year with Dr. Schuyler Amor.  Immunizations --Prevnar 13 Colonoscopy -- 2013 Diet: Eats healthy  Exercise: Walks at work. Nothing outside of work     Past Medical History  Diagnosis Date  . Bipolar 1 disorder ([place])   . Anxiety   . Depression   . Hyperlipidemia   . Arthritis     Past Surgical History  Procedure Laterality Date  . Arthroscopy arm  2003    right elbow bone frag  . Ankle fracture surgery      age 70-lt  . Colonoscopy    . Rotator cuff repair Right 2015?    Current Outpatient Prescriptions on File Prior to Visit  Medication Sig Dispense Refill  . aspirin 81 MG tablet Take 81 mg by mouth daily.    . diazepam (VALIUM) 5 MG tablet Take 1 tablet (5 mg total) by mouth 3 (three) times daily as needed. 100 tablet 5  . QUEtiapine (SEROQUEL) 100 MG tablet TAKE 2 TABLETS BY MOUTH EVERY NIGHT AT BEDTIME 440 tablet 0  . simvastatin (ZOCOR) 20 MG tablet TAKE 1 TABLET BY MOUTH EVERY NIGHT AT BEDTIME 130 tablet 0   No current facility-administered medications on file prior to visit.    Allergies  Allergen Reactions  . Codeine Phosphate Nausea And Vomiting    Family History  Problem Relation Age of Onset  . Colon cancer Neg Hx   . Stomach cancer Neg Hx   . COPD Mother   . Heart disease  Mother     Social History   Social History  . Marital Status: Married    Spouse Name: N/A  . Number of Children: N/A  . Years of Education: N/A   Occupational History  . Material [employer]    Social History Main Topics  . Smoking status: Former Smoker    Types: Cigarettes    Quit date: 03/02/2011  . Smokeless tobacco: Never Used  . Alcohol Use: No  . Drug Use: No  . Sexual Activity: Not on file   Other Topics Concern  . Not on file   Social History Narrative   Works in a Journalist, newspaper [employer]     Review of Systems  Constitutional: Negative.   HENT: Negative.   Respiratory: Negative.   Cardiovascular: Negative.   Gastrointestinal: Negative.   Genitourinary: Negative.   Skin: Negative.   Neurological: Negative.   Psychiatric/Behavioral: Positive for depression. Negative for suicidal ideas, hallucinations and substance abuse. The patient is nervous/anxious and has insomnia.   All other systems reviewed and are negative.   BP 134/70 mmHg  Temp(Src) 98.3 F (36.8 C) (Oral)  Ht 5\' 8"  (1.727 m)  Wt 148 lb 3.2 oz (67.223 kg)  BMI 22.54 kg/m2  Physical Exam  Constitutional: He is oriented to person, place, and time and well-developed, well-nourished, and in no distress. No distress.  HENT:  Head: Normocephalic and atraumatic.  Right Ear: External ear normal.  Left Ear: External ear normal.  Nose: Nose normal.  Mouth/Throat: Oropharynx is clear and moist. No oropharyngeal exudate.  Eyes: Conjunctivae and EOM are normal. Pupils are equal, round, and reactive to light. Right eye exhibits no discharge. Left eye exhibits no discharge.  Neck: Normal range of motion. Neck supple.  Cardiovascular: Normal rate, regular rhythm, normal heart sounds and intact distal pulses.  Exam reveals no gallop and no friction rub.   No murmur heard. Pulmonary/Chest: Effort normal and breath sounds normal. No respiratory distress. He has no wheezes. He has no rales. He exhibits no  tenderness.  Musculoskeletal: Normal range of motion. He exhibits no edema or tenderness.  Lymphadenopathy:    He has no cervical adenopathy.  Neurological: He is alert and oriented to person, place, and time. Gait normal. GCS score is 15.  Skin: Skin is warm and dry. No rash noted. He is not diaphoretic. No erythema. No pallor.  Psychiatric: Mood, memory, affect and judgment normal.  Nursing note and vitals reviewed.    Assessment/Plan: 1. Encounter to establish care - Follow up for CPE - Work on increasing exercise - Follow up sooner for any acute issues.   2. Need for prophylactic vaccination against Streptococcus pneumoniae (pneumococcus)  - Pneumococcal conjugate vaccine 13-valent  3. Depression - Well controlled. I am ok with him weaning himself off Seroquel as it appears that it was used more for sleep. We talked about d/c valium. We will do this in the future. I would like to supplement him with celexa for his anxiety instead.    Dorothyann Peng, NP

## 2015-11-04 ENCOUNTER — Other Ambulatory Visit: Payer: Self-pay | Admitting: Adult Health

## 2015-11-04 NOTE — Telephone Encounter (Signed)
Ok to refill 

## 2015-11-05 NOTE — Telephone Encounter (Signed)
Ok to refill for one month  

## 2015-12-03 ENCOUNTER — Ambulatory Visit (INDEPENDENT_AMBULATORY_CARE_PROVIDER_SITE_OTHER): Payer: Medicare Other | Admitting: Adult Health

## 2015-12-03 ENCOUNTER — Encounter: Payer: Self-pay | Admitting: Adult Health

## 2015-12-03 VITALS — BP 128/68 | Temp 98.1°F | Ht 68.0 in | Wt 144.8 lb

## 2015-12-03 DIAGNOSIS — N400 Enlarged prostate without lower urinary tract symptoms: Secondary | ICD-10-CM | POA: Diagnosis not present

## 2015-12-03 DIAGNOSIS — E785 Hyperlipidemia, unspecified: Secondary | ICD-10-CM

## 2015-12-03 DIAGNOSIS — F418 Other specified anxiety disorders: Secondary | ICD-10-CM | POA: Diagnosis not present

## 2015-12-03 DIAGNOSIS — Z1159 Encounter for screening for other viral diseases: Secondary | ICD-10-CM | POA: Diagnosis not present

## 2015-12-03 LAB — POC URINALSYSI DIPSTICK (AUTOMATED)
BILIRUBIN UA: NEGATIVE
GLUCOSE UA: NEGATIVE
Ketones, UA: NEGATIVE
LEUKOCYTES UA: NEGATIVE
NITRITE UA: NEGATIVE
PH UA: 5.5
Protein, UA: NEGATIVE
Spec Grav, UA: <=1.005
UROBILINOGEN UA: 0.2

## 2015-12-03 LAB — LIPID PANEL
CHOLESTEROL: 154 mg/dL (ref 0–200)
HDL: 42.4 mg/dL (ref >39.00)
LDL Cholesterol: 93 mg/dL (ref 0–99)
NonHDL: 111.49
TRIGLYCERIDES: 91 mg/dL (ref 0.0–149.0)
Total CHOL/HDL Ratio: 4
VLDL: 18.2 mg/dL (ref 0.0–40.0)

## 2015-12-03 LAB — BASIC METABOLIC PANEL
BUN: 14 mg/dL (ref 6–23)
CHLORIDE: 107 meq/L (ref 96–112)
CO2: 27 mEq/L (ref 19–32)
Calcium: 9.5 mg/dL (ref 8.4–10.5)
Creatinine, Ser: 0.86 mg/dL (ref 0.40–1.50)
GFR: 94.67 mL/min (ref >60.00)
GLUCOSE: 91 mg/dL (ref 70–99)
POTASSIUM: 4.1 meq/L (ref 3.5–5.1)
SODIUM: 141 meq/L (ref 135–145)

## 2015-12-03 LAB — HEPATIC FUNCTION PANEL
ALT: 46 U/L (ref 0–53)
AST: 42 U/L — AB (ref 0–37)
Albumin: 4.4 g/dL (ref 3.5–5.2)
Alkaline Phosphatase: 53 U/L (ref 39–117)
BILIRUBIN TOTAL: 0.8 mg/dL (ref 0.2–1.2)
Bilirubin, Direct: 0.2 mg/dL (ref 0.0–0.3)
TOTAL PROTEIN: 7.1 g/dL (ref 6.0–8.3)

## 2015-12-03 LAB — CBC WITH DIFFERENTIAL/PLATELET
BASOS PCT: 1.1 % (ref 0.0–3.0)
Basophils Absolute: 0.1 10*3/uL (ref 0.0–0.1)
EOS PCT: 2.8 % (ref 0.0–5.0)
Eosinophils Absolute: 0.1 10*3/uL (ref 0.0–0.7)
HCT: 43.3 % (ref 39.0–52.0)
HEMOGLOBIN: 14.9 g/dL (ref 13.0–17.0)
Lymphocytes Relative: 31.4 % (ref 12.0–46.0)
Lymphs Abs: 1.7 10*3/uL (ref 0.7–4.0)
MCHC: 34.3 g/dL (ref 30.0–36.0)
MCV: 93 fl (ref 78.0–100.0)
MONO ABS: 0.6 10*3/uL (ref 0.1–1.0)
MONOS PCT: 10.5 % (ref 3.0–12.0)
Neutro Abs: 2.9 10*3/uL (ref 1.4–7.7)
Neutrophils Relative %: 54.2 % (ref 43.0–77.0)
Platelets: 177 10*3/uL (ref 150.0–400.0)
RBC: 4.66 Mil/uL (ref 4.22–5.81)
RDW: 12.6 % (ref 11.5–15.5)
WBC: 5.4 10*3/uL (ref 4.0–10.5)

## 2015-12-03 LAB — TSH: TSH: 0.65 u[IU]/mL (ref 0.35–4.50)

## 2015-12-03 LAB — PSA: PSA: 0.91 ng/mL (ref 0.10–4.00)

## 2015-12-03 MED ORDER — SIMVASTATIN 20 MG PO TABS: 20.0000 mg | ORAL_TABLET | Freq: Every day | ORAL | Status: DC

## 2015-12-03 MED ORDER — DIAZEPAM 5 MG PO TABS: 5.0000 mg | ORAL_TABLET | Freq: Three times a day (TID) | ORAL | Status: DC | PRN

## 2015-12-03 NOTE — Progress Notes (Signed)
Subjective:    Patient ID: John Holland, male    DOB: [birth year], 65 y.o.   MRN: [id number]  HPI  65 year old male patient who presents to the office today for follow up regarding hyperlipidemia, depression and anxiety.   He has a long-standing history of depression and has  been treated by Dr. Caprice Beaver in the past. When I last saw him he was weaning himself off Seroquel .  He also uses Zocor and an aspirin tablet daily for hyperlipidemia  He gets routine eye care, dental care, colonoscopy 2015 normal, vaccinations up-to-date  Today in the office he reports that he is doing over well. He has no complaints today.   He is down to 50 mg of Seroquel daily and is feeling " fine"  He is talking Valium 2-3 tabs daily.    Review of Systems  Constitutional: Negative.   HENT: Negative.   Eyes: Negative.   Respiratory: Negative.   Cardiovascular: Negative.   Gastrointestinal: Negative.   Genitourinary: Negative.   Musculoskeletal: Negative.   Skin: Negative.   Allergic/Immunologic: Negative.   Neurological: Negative.   Hematological: Negative.   Psychiatric/Behavioral: Negative.   All other systems reviewed and are negative.  Past Medical History  Diagnosis Date  . Bipolar 1 disorder ([place])   . Anxiety   . Depression   . Hyperlipidemia   . Arthritis     Social History   Social History  . Marital Status: Married    Spouse Name: N/A  . Number of Children: N/A  . Years of Education: N/A   Occupational History  . Material [employer]    Social History Main Topics  . Smoking status: Former Smoker    Types: Cigarettes    Quit date: 03/02/2011  . Smokeless tobacco: Never Used  . Alcohol Use: No  . Drug Use: No  . Sexual Activity: Not on file   Other Topics Concern  . Not on file   Social History Narrative   Works in a Publishing rights manager     Past Surgical History  Procedure Laterality Date  . Arthroscopy arm  2003    right elbow bone frag  . Ankle  fracture surgery      age 76-lt  . Colonoscopy    . Rotator cuff repair Right 2015?    Family History  Problem Relation Age of Onset  . Colon cancer Neg Hx   . Stomach cancer Neg Hx   . COPD Mother   . Heart disease Mother     Allergies  Allergen Reactions  . Codeine Phosphate Nausea And Vomiting    Current Outpatient Prescriptions on File Prior to Visit  Medication Sig Dispense Refill  . aspirin 81 MG tablet Take 81 mg by mouth daily.    . diazepam (VALIUM) 5 MG tablet TAKE 1 TABLET BY MOUTH THREE TIMES DAILY AS NEEDED 100 tablet 0  . QUEtiapine (SEROQUEL) 100 MG tablet TAKE 2 TABLETS BY MOUTH EVERY NIGHT AT BEDTIME (Patient taking differently: Pt taking 50mg  once daily) 440 tablet 0  . simvastatin (ZOCOR) 20 MG tablet TAKE 1 TABLET BY MOUTH EVERY NIGHT AT BEDTIME 130 tablet 0   No current facility-administered medications on file prior to visit.    BP 142/64 mmHg  Temp(Src) 98.1 F (36.7 C) (Oral)  Ht 5\' 8"  (1.727 m)  Wt 144 lb 12.8 oz (65.681 kg)  BMI 22.02 kg/m2       Objective:   Physical Exam  Constitutional:  He is oriented to person, place, and time. He appears well-developed and well-nourished. No distress.  HENT:  Head: Normocephalic and atraumatic.  Right Ear: External ear normal.  Left Ear: External ear normal.  Nose: Nose normal.  Mouth/Throat: Oropharynx is clear and moist. No oropharyngeal exudate.  Eyes: Conjunctivae and EOM are normal. Pupils are equal, round, and reactive to light. Right eye exhibits no discharge. Left eye exhibits no discharge. No scleral icterus.  Neck: Normal range of motion. Neck supple. No JVD present. No tracheal deviation present. No thyromegaly present.  Cardiovascular: Normal rate, regular rhythm, normal heart sounds and intact distal pulses.  Exam reveals no gallop and no friction rub.   No murmur heard. Pulmonary/Chest: Effort normal and breath sounds normal. No stridor. No respiratory distress. He has no wheezes. He has  no rales. He exhibits no tenderness.  Abdominal: Soft. Bowel sounds are normal. He exhibits no distension and no mass. There is no tenderness. There is no rebound and no guarding.  Genitourinary: Rectum normal and penis normal. Guaiac negative stool. Prostate is enlarged. Prostate is not tender. No penile tenderness.  Musculoskeletal: Normal range of motion. He exhibits no edema or tenderness.  Lymphadenopathy:    He has no cervical adenopathy.  Neurological: He is alert and oriented to person, place, and time. He has normal reflexes. He displays normal reflexes. No cranial nerve deficit. He exhibits normal muscle tone. Coordination normal.  Skin: Skin is warm and dry. No rash noted. No erythema. No pallor.  Psychiatric: He has a normal mood and affect. His behavior is normal. Judgment and thought content normal.  Nursing note and vitals reviewed.     Assessment & Plan:  1. Hyperlipidemia - EKG 12-Lead- Sinus Brady with negative precordial T waves- rate 50. WNL - POCT Urinalysis Dipstick (Automated) - Basic metabolic panel - CBC with Differential/Platelet - Hepatic function panel - Lipid panel - TSH - PSA - simvastatin (ZOCOR) 20 MG tablet; Take 1 tablet (20 mg total) by mouth at bedtime.  Dispense: 90 tablet; Refill: 3 - Hep C Antibody  2. Depression with anxiety - Well controlled.  - I would like him to switch to another anxiety medication. Once he is off Seroquel we will look into this - diazepam (VALIUM) 5 MG tablet; Take 1 tablet (5 mg total) by mouth 3 (three) times daily as needed.  Dispense: 100 tablet; Refill: 0  3. Enlarged prostate - No issues with nocturia or decreased stream.  - PSA  Dorothyann Peng, NP

## 2015-12-03 NOTE — Patient Instructions (Addendum)
It was great seeing you today!  I will follow up with you regarding your labs  Continue to eat healthy and exercise  Follow up with me in one year or sooner if needed  Health Maintenance, Male A healthy lifestyle and preventative care can promote health and wellness.  Maintain regular health, dental, and eye exams.  Eat a healthy diet. Foods like vegetables, fruits, whole grains, low-fat dairy products, and lean protein foods contain the nutrients you need and are low in calories. Decrease your intake of foods high in solid fats, added sugars, and salt. Get information about a proper diet from your health care provider, if necessary.  Regular physical exercise is one of the most important things you can do for your health. Most adults should get at least 150 minutes of moderate-intensity exercise (any activity that increases your heart rate and causes you to sweat) each week. In addition, most adults need muscle-strengthening exercises on 2 or more days a week.   Maintain a healthy weight. The body mass index (BMI) is a screening tool to identify possible weight problems. It provides an estimate of body fat based on height and weight. Your health care provider can find your BMI and can help you achieve or maintain a healthy weight. For males 20 years and older:  A BMI below 18.5 is considered underweight.  A BMI of 18.5 to 24.9 is normal.  A BMI of 25 to 29.9 is considered overweight.  A BMI of 30 and above is considered obese.  Maintain normal blood lipids and cholesterol by exercising and minimizing your intake of saturated fat. Eat a balanced diet with plenty of fruits and vegetables. Blood tests for lipids and cholesterol should begin at age 45 and be repeated every 5 years. If your lipid or cholesterol levels are high, you are over age 79, or you are at high risk for heart disease, you may need your cholesterol levels checked more frequently.Ongoing high lipid and cholesterol levels  should be treated with medicines if diet and exercise are not working.  If you smoke, find out from your health care provider how to quit. If you do not use tobacco, do not start.  Lung cancer screening is recommended for adults aged 41-80 years who are at high risk for developing lung cancer because of a history of smoking. A yearly low-dose CT scan of the lungs is recommended for people who have at least a 30-pack-year history of smoking and are current smokers or have quit within the past 15 years. A pack year of smoking is smoking an average of 1 pack of cigarettes a day for 1 year (for example, a 30-pack-year history of smoking could mean smoking 1 pack a day for 30 years or 2 packs a day for 15 years). Yearly screening should continue until the smoker has stopped smoking for at least 15 years. Yearly screening should be stopped for people who develop a health problem that would prevent them from having lung cancer treatment.  If you choose to drink alcohol, do not have more than 2 drinks per day. One drink is considered to be 12 oz (360 mL) of beer, 5 oz (150 mL) of wine, or 1.5 oz (45 mL) of liquor.  Avoid the use of street drugs. Do not share needles with anyone. Ask for help if you need support or instructions about stopping the use of drugs.  High blood pressure causes heart disease and increases the risk of stroke. High blood pressure  is more likely to develop in:  People who have blood pressure in the end of the normal range (100-139/85-89 mm Hg).  People who are overweight or obese.  People who are African American.  If you are 34-12 years of age, have your blood pressure checked every 3-5 years. If you are 35 years of age or older, have your blood pressure checked every year. You should have your blood pressure measured twice--once when you are at a hospital or clinic, and once when you are not at a hospital or clinic. Record the average of the two measurements. To check your blood  pressure when you are not at a hospital or clinic, you can use:  An automated blood pressure machine at a pharmacy.  A home blood pressure monitor.  If you are 53-23 years old, ask your health care provider if you should take aspirin to prevent heart disease.  Diabetes screening involves taking a blood sample to check your fasting blood sugar level. This should be done once every 3 years after age 73 if you are at a normal weight and without risk factors for diabetes. Testing should be considered at a younger age or be carried out more frequently if you are overweight and have at least 1 risk factor for diabetes.  Colorectal cancer can be detected and often prevented. Most routine colorectal cancer screening begins at the age of 54 and continues through age 72. However, your health care provider may recommend screening at an earlier age if you have risk factors for colon cancer. On a yearly basis, your health care provider may provide home test kits to check for hidden blood in the stool. A small camera at the end of a tube may be used to directly examine the colon (sigmoidoscopy or colonoscopy) to detect the earliest forms of colorectal cancer. Talk to your health care provider about this at age 41 when routine screening begins. A direct exam of the colon should be repeated every 5-10 years through age 73, unless early forms of precancerous polyps or small growths are found.  People who are at an increased risk for hepatitis B should be screened for this virus. You are considered at high risk for hepatitis B if:  You were born in a country where hepatitis B occurs often. Talk with your health care provider about which countries are considered high risk.  Your parents were born in a high-risk country and you have not received a shot to protect against hepatitis B (hepatitis B vaccine).  You have HIV or AIDS.  You use needles to inject street drugs.  You live with, or have sex with, someone who  has hepatitis B.  You are a man who has sex with other men (MSM).  You get hemodialysis treatment.  You take certain medicines for conditions like cancer, organ transplantation, and autoimmune conditions.  Hepatitis C blood testing is recommended for all people born from [birth year] through 1966 and any individual with known risk factors for hepatitis C.  Healthy men should no longer receive prostate-specific antigen (PSA) blood tests as part of routine cancer screening. Talk to your health care provider about prostate cancer screening.  Testicular cancer screening is not recommended for adolescents or adult males who have no symptoms. Screening includes self-exam, a health care provider exam, and other screening tests. Consult with your health care provider about any symptoms you have or any concerns you have about testicular cancer.  Practice safe sex. Use condoms and avoid high-risk  sexual practices to reduce the spread of sexually transmitted infections (STIs).  You should be screened for STIs, including gonorrhea and chlamydia if:  You are sexually active and are younger than 24 years.  You are older than 24 years, and your health care provider tells you that you are at risk for this type of infection.  Your sexual activity has changed since you were last screened, and you are at an increased risk for chlamydia or gonorrhea. Ask your health care provider if you are at risk.  If you are at risk of being infected with HIV, it is recommended that you take a prescription medicine daily to prevent HIV infection. This is called pre-exposure prophylaxis (PrEP). You are considered at risk if:  You are a man who has sex with other men (MSM).  You are a heterosexual man who is sexually active with multiple partners.  You take drugs by injection.  You are sexually active with a partner who has HIV.  Talk with your health care provider about whether you are at high risk of being infected with  HIV. If you choose to begin PrEP, you should first be tested for HIV. You should then be tested every 3 months for as long as you are taking PrEP.  Use sunscreen. Apply sunscreen liberally and repeatedly throughout the day. You should seek shade when your shadow is shorter than you. Protect yourself by wearing long sleeves, pants, a wide-brimmed hat, and sunglasses year round whenever you are outdoors.  Tell your health care provider of new moles or changes in moles, especially if there is a change in shape or color. Also, tell your health care provider if a mole is larger than the size of a pencil eraser.  A one-time screening for abdominal aortic aneurysm (AAA) and surgical repair of large AAAs by ultrasound is recommended for men aged 86-75 years who are current or former smokers.  Stay current with your vaccines (immunizations).   This information is not intended to replace advice given to you by your health care provider. Make sure you discuss any questions you have with your health care provider.   Document Released: 11/20/2007 Document Revised: 06/14/2014 Document Reviewed: 10/19/2010 Elsevier Interactive Patient Education [employer].

## 2015-12-04 LAB — HEPATITIS C ANTIBODY: HCV Ab: REACTIVE — AB

## 2015-12-05 ENCOUNTER — Encounter: Payer: Self-pay | Admitting: Adult Health

## 2015-12-05 ENCOUNTER — Telehealth: Payer: Self-pay | Admitting: Adult Health

## 2015-12-05 DIAGNOSIS — B1920 Unspecified viral hepatitis C without hepatic coma: Secondary | ICD-10-CM

## 2015-12-05 HISTORY — DX: Unspecified viral hepatitis C without hepatic coma: B19.20

## 2015-12-05 LAB — HEPATITIS C RNA QUANTITATIVE
HCV QUANT LOG: 5.68 {Log} — AB (ref <1.18)
HCV Quantitative: 478377 IU/mL — ABNORMAL HIGH (ref <15)

## 2015-12-05 NOTE — Telephone Encounter (Signed)
Spoke to patient regarding his labs. He was notified that he was Hep C positive. He would like to see someone about being treated.   Will refer to Hep C clinic.

## 2015-12-11 ENCOUNTER — Other Ambulatory Visit: Payer: Self-pay | Admitting: Adult Health

## 2015-12-11 ENCOUNTER — Telehealth: Payer: Self-pay | Admitting: Adult Health

## 2015-12-11 DIAGNOSIS — B182 Chronic viral hepatitis C: Secondary | ICD-10-CM

## 2015-12-11 NOTE — Telephone Encounter (Addendum)
Pt would like to go to [facility] for [facility] and not Pharmacist, hospital center

## 2015-12-11 NOTE — Telephone Encounter (Signed)
Please advise 

## 2015-12-11 NOTE — Telephone Encounter (Signed)
Error/njr °

## 2015-12-11 NOTE — Telephone Encounter (Signed)
Referral placed. It will probably take longer to get into ID

## 2015-12-12 NOTE — Telephone Encounter (Signed)
Patient notified. Patient verbalized understanding.

## 2015-12-16 ENCOUNTER — Other Ambulatory Visit: Payer: Medicare Other

## 2015-12-16 ENCOUNTER — Other Ambulatory Visit: Payer: Self-pay | Admitting: Internal Medicine

## 2015-12-16 DIAGNOSIS — B182 Chronic viral hepatitis C: Secondary | ICD-10-CM

## 2015-12-16 DIAGNOSIS — K7400 Hepatic fibrosis, unspecified: Secondary | ICD-10-CM

## 2015-12-16 DIAGNOSIS — I2510 Atherosclerotic heart disease of native coronary artery without angina pectoris: Secondary | ICD-10-CM | POA: Diagnosis not present

## 2015-12-17 ENCOUNTER — Other Ambulatory Visit: Payer: Medicare Other

## 2015-12-17 ENCOUNTER — Ambulatory Visit (INDEPENDENT_AMBULATORY_CARE_PROVIDER_SITE_OTHER): Payer: Medicare Other | Admitting: Internal Medicine

## 2015-12-17 ENCOUNTER — Encounter: Payer: Self-pay | Admitting: Internal Medicine

## 2015-12-17 VITALS — Temp 98.6°F | Ht 69.0 in | Wt 143.0 lb

## 2015-12-17 DIAGNOSIS — B182 Chronic viral hepatitis C: Secondary | ICD-10-CM | POA: Diagnosis present

## 2015-12-17 LAB — COMPREHENSIVE METABOLIC PANEL
ALT: 35 U/L (ref 9–46)
AST: 30 U/L (ref 10–35)
Albumin: 4.3 g/dL (ref 3.6–5.1)
Alkaline Phosphatase: 48 U/L (ref 40–115)
BUN: 19 mg/dL (ref 7–25)
CHLORIDE: 108 mmol/L (ref 98–110)
CO2: 23 mmol/L (ref 20–31)
Calcium: 9.1 mg/dL (ref 8.6–10.3)
Creat: 1.09 mg/dL (ref 0.70–1.25)
GLUCOSE: 89 mg/dL (ref 65–99)
POTASSIUM: 4.3 mmol/L (ref 3.5–5.3)
Sodium: 143 mmol/L (ref 135–146)
Total Bilirubin: 0.4 mg/dL (ref 0.2–1.2)
Total Protein: 6.7 g/dL (ref 6.1–8.1)

## 2015-12-17 LAB — PROTIME-INR
INR: 1
Prothrombin Time: 10.9 s (ref 9.0–11.5)

## 2015-12-17 LAB — AFP TUMOR MARKER: AFP-Tumor Marker: 5.7 ng/mL (ref <6.1)

## 2015-12-17 LAB — HEPATITIS A ANTIBODY, TOTAL: Hep A Total Ab: REACTIVE — AB

## 2015-12-17 LAB — HEPATITIS B CORE ANTIBODY, TOTAL: Hep B Core Total Ab: REACTIVE — AB

## 2015-12-17 NOTE — Patient Instructions (Signed)
Date 12/17/2015  Dear John Holland, As discussed in the [facility], your hepatitis C therapy will include the following medications:          Harvoni 90mg /400mg  tablet:           Take 1 tablet by mouth once daily   Please note that ALL MEDICATIONS WILL START ON THE SAME DATE for a total of 12 weeks. ---------------------------------------------------------------- Your HCV Treatment Start Date: TBA   Your HCV genotype:  unknown    Liver Fibrosis: TBD    ---------------------------------------------------------------- YOUR PHARMACY CONTACT:   [place] [facility] and [place] Phone: [phone] Hours: Monday to Friday 7:30 am to 6:00 pm   Please always contact your pharmacy at least 3-4 business days before you run out of medications to ensure your next month's medication is ready or 1 week prior to running out if you receive it by mail.  Remember, each prescription is for 28 days. ---------------------------------------------------------------- GENERAL NOTES REGARDING YOUR HEPATITIS C MEDICATION:  SOFOSBUVIR/LEDIPASVIR (HARVONI): - Harvoni tablet is taken daily with OR without food. - The tablets are orange. - The tablets should be stored at room temperature.  - Acid reducing agents such as H2 blockers (ie. Pepcid (famotidine), Zantac (ranitidine), Tagamet (cimetidine), Axid (nizatidine) and proton pump inhibitors (ie. Prilosec (omeprazole), Protonix (pantoprazole), Nexium (esomeprazole), or Aciphex (rabeprazole)) can decrease effectiveness of Harvoni. Do not take until you have discussed with a health care provider.    -Antacids that contain magnesium and/or aluminum hydroxide (ie. Milk of Magensia, Rolaids, Gaviscon, Maalox, Mylanta, an dArthritis Pain Formula)can reduce absorption of Harvoni, so take them at least 4 hours before or after Harvoni.  -Calcium carbonate (calcium supplements or antacids such as Tums,  Caltrate, Os-Cal)needs to be taken at least 4 hours hours before or after Harvoni.  -St. John's wort or any products that contain St. John's wort like some herbal supplements  Please inform the office prior to starting any of these medications.  - The common side effects associated with Harvoni include:      1. Fatigue      2. Headache      3. Nausea      4. Diarrhea      5. Insomnia  Please note that this only lists the most common side effects and is NOT a comprehensive list of the potential side effects of these medications. For more information, please review the drug information sheets that come with your medication package from the pharmacy.  ---------------------------------------------------------------- GENERAL HELPFUL HINTS ON HCV THERAPY: 1. Stay well-hydrated. 2. Notify the [facility] of any changes in your other over-the-counter/herbal or prescription medications. 3. If you miss a dose of your medication, take the missed dose as soon as you remember. Return to your regular time/dose schedule the next day.  4.  Do not stop taking your medications without first talking with your healthcare provider. 5.  You may take Tylenol (acetaminophen), as long as the dose is less than 2000 mg (OR no more than 4 tablets of the Tylenol Extra Strengths 500mg  tablet) in 24 hours. 6.  You will see our pharmacist-specialist within the first 2 weeks of starting your medication. 7.  You will need to obtain routine labs around week 4 and12 weeks after starting and then 3 to 6 months after finishing Harvoni.    Scharlene Gloss, [place] [facility] [place],  [place]  [id number] [phone]

## 2015-12-17 NOTE — Progress Notes (Signed)
[facility]   CC: consideration for treatment for chronic hepatitis C  HPI:  +John Holland is a 65 y.o. male who presents for initial evaluation and management of chronic hepatitis C.  Patient tested positive at his recent yearly physical by his PCP. Hepatitis C-associated risk factors present are: none. Patient denies history of blood transfusion, IV drug abuse, renal dialysis, sexual contact with person with liver disease. Patient has had other studies performed. Results: hepatitis C RNA by PCR, result: positive. Patient has not had prior treatment for Hepatitis C. Patient does not have a past history of liver disease. Patient does not have a family history of liver disease. Patient does not  have associated signs or symptoms related to liver disease.  Labs reviewed and confirm chronic hepatitis C with a positive viral load.   Records reviewed from PCP, on simvistatin for hypercholesterolemia, no CVA or CAD/MI history.   Had labs yesterday so unknown genotype at this time.      Patient does not have documented immunity to Hepatitis A. Patient does not have documented immunity to Hepatitis B.    Review of Systems:   Constitutional: negative for fatigue and malaise Gastrointestinal: negative for diarrhea Musculoskeletal: negative for myalgias and arthralgias All other systems reviewed and are negative      Past Medical History  Diagnosis Date  . Bipolar 1 disorder ([place])   . Anxiety   . Depression   . Hyperlipidemia   . Arthritis   . Hepatitis C 12/05/2015    Prior to Admission medications   Medication Sig Start Date End Date Taking? Authorizing Provider  aspirin 81 MG tablet Take 81 mg by mouth daily.   Yes Historical Provider, MD  diazepam (VALIUM) 5 MG tablet Take 1 tablet (5 mg total) by mouth 3 (three) times daily as needed. 12/03/15  Yes Dorothyann Peng, NP  QUEtiapine (SEROQUEL) 100 MG tablet TAKE 2 TABLETS BY MOUTH EVERY NIGHT AT  BEDTIME Patient taking differently: Pt taking 50mg  once daily 09/29/15  Yes Dorena Cookey, MD  simvastatin (ZOCOR) 20 MG tablet Take 1 tablet (20 mg total) by mouth at bedtime. 12/03/15  Yes Dorothyann Peng, NP    Allergies  Allergen Reactions  . Codeine Phosphate Nausea And Vomiting    Social History  Substance Use Topics  . Smoking status: Former Smoker    Types: Cigarettes    Quit date: 03/02/2011  . Smokeless tobacco: Never Used  . Alcohol Use: No    Family History  Problem Relation Age of Onset  . Colon cancer Neg Hx   . Stomach cancer Neg Hx   . COPD Mother   . Heart disease Mother   no liver cancer   Objective:  Constitutional: in no apparent distress and alert,  Filed Vitals:   12/17/15 0844  Temp: 98.6 F (37 C)   Eyes: anicteric Cardiovascular: Cor RRR and No murmurs Respiratory: CTA B; normal respiratory effort Gastrointestinal: Bowel sounds are normal, liver is not enlarged, spleen is not enlarged Musculoskeletal: no pedal edema noted Skin: negatives: no rash; no porphyria cutanea tarda Lymphatic: no cervical lymphadenopathy   Laboratory Genotype: No results found for: HCVGENOTYPE HCV viral load:  Lab Results  Component Value Date   HCVQUANT [id number]* 12/03/2015   Lab Results  Component Value Date   WBC 5.4 12/03/2015   HGB 14.9 12/03/2015   HCT 43.3 12/03/2015   MCV 93.0 12/03/2015   PLT 177.0 12/03/2015    Lab  Results  Component Value Date   CREATININE 1.09 12/16/2015   BUN 19 12/16/2015   NA 143 12/16/2015   K 4.3 12/16/2015   CL 108 12/16/2015   CO2 23 12/16/2015    Lab Results  Component Value Date   ALT 35 12/16/2015   AST 30 12/16/2015   ALKPHOS 48 12/16/2015     Labs and history reviewed and show CHILD-PUGH A  5-6 points: Child class A 7-9 points: Child class B 10-15 points: Child class C  Lab Results  Component Value Date   INR 1.0 12/16/2015   BILITOT 0.4 12/16/2015   ALBUMIN 4.3 12/16/2015     Assessment: New  Patient with Chronic Hepatitis C genotype unkown, untreated.  I discussed with the patient the lab findings that confirm chronic hepatitis C as well as the natural history and progression of disease including about 30% of people who develop cirrhosis of the liver if left untreated and once cirrhosis is established there is a 2-7% risk per year of liver cancer and liver failure.  I discussed the importance of treatment and benefits in reducing the risk, even if significant liver fibrosis exists.   Plan: 1) Patient counseled extensively on limiting acetaminophen to no more than 2 grams daily, avoidance of alcohol. 2) Transmission discussed with patient including sexual transmission, sharing razors and toothbrush.   3) Will need referral to gastroenterology if concern for cirrhosis 4) Will need referral for substance abuse counseling: No.; Further work up to include urine drug screen  No. 5) Will prescribe Harvoni for 12 weeks 6) Hepatitis A vaccine No.checking titers 7) Hepatitis B vaccine No. 8) Pneumovax vaccine if concern for cirrhosis 9) Further work up to include liver staging with elastography 10) will follow up after starting medication, will prescribe Harvoni or Epclusa once genotype known.

## 2015-12-18 LAB — HIV ANTIBODY (ROUTINE TESTING W REFLEX): HIV 1&2 Ab, 4th Generation: NONREACTIVE

## 2015-12-19 LAB — LIVER FIBROSIS, FIBROTEST-ACTITEST
ALT: 33 U/L (ref 9–46)
Alpha-2-Macroglobulin: 392 mg/dL — ABNORMAL HIGH (ref 106–279)
Apolipoprotein A1: 129 mg/dL (ref 94–176)
Bilirubin: 0.3 mg/dL (ref 0.2–1.2)
Fibrosis Score: 0.76
GGT: 46 U/L (ref 3–70)
Haptoglobin: 44 mg/dL (ref 43–212)
NECROINFLAMMAT ACT SCORE: 0.29
Reference ID: 1575790

## 2015-12-20 LAB — HCV RNA,LIPA RFLX NS5A DRUG RESIST

## 2015-12-20 LAB — HCV RNA, QUANT REAL-TIME PCR W/REFLEX
HCV RNA, PCR, QN (LOG): 5.53 {Log_IU}/mL — AB
HCV RNA, PCR, QN: 342000 IU/mL — ABNORMAL HIGH

## 2015-12-22 ENCOUNTER — Other Ambulatory Visit: Payer: Self-pay | Admitting: Internal Medicine

## 2015-12-22 LAB — HCV RNA NS5A DRUG RESISTANCE

## 2015-12-22 MED ORDER — LEDIPASVIR-SOFOSBUVIR 90-400 MG PO TABS: 1.0000 | ORAL_TABLET | Freq: Every day | ORAL | Status: DC

## 2015-12-26 MED FILL — *HARVONI 90-400 MG TABLET: 90-400 | 28 days supply | Qty: 28 | Fill #0

## 2015-12-29 ENCOUNTER — Encounter: Payer: Self-pay | Admitting: Pharmacy Technician

## 2016-01-20 ENCOUNTER — Ambulatory Visit (HOSPITAL_COMMUNITY)
Admission: RE | Admit: 2016-01-20 | Discharge: 2016-01-20 | Disposition: A | Discharge status: Home / Self Care | Payer: Medicare Other | Source: Ambulatory Visit | Attending: Internal Medicine | Admitting: Internal Medicine

## 2016-01-20 ENCOUNTER — Other Ambulatory Visit: Payer: Medicare Other

## 2016-01-20 ENCOUNTER — Ambulatory Visit: Payer: Medicare Other | Admitting: Pharmacist

## 2016-01-20 DIAGNOSIS — R938 Abnormal findings on diagnostic imaging of other specified body structures: Secondary | ICD-10-CM | POA: Insufficient documentation

## 2016-01-20 DIAGNOSIS — B182 Chronic viral hepatitis C: Secondary | ICD-10-CM

## 2016-01-20 DIAGNOSIS — B1920 Unspecified viral hepatitis C without hepatic coma: Secondary | ICD-10-CM | POA: Diagnosis not present

## 2016-01-20 MED FILL — *HARVONI 90-400 MG TABLET: 90-400 | 28 days supply | Qty: 28 | Fill #1

## 2016-01-20 NOTE — Patient Instructions (Signed)
Hep C viral load

## 2016-01-20 NOTE — Progress Notes (Signed)
HPI: John Holland is a 65 y.o. male who presents to the [facility] today for follow-up of his Hep C. 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 He started taking Harvoni on 7/21.  He brings his medications here for me to review today.  He is taking quetiapine, aspirin, diazepam, and simvastatin.  He has recently started cutting his quetiapine 100 mg in half (taking 50 mg) in hopes of weaning himself off of the medication. His MD is aware.   He has 2 Harvoni pills left in his first bottle. He tells me he has not missed any doses of his Harvoni.  No results found for: HCVGENOTYPE, HEPCGENOTYPE  Allergies: Allergies  Allergen Reactions  . Codeine Phosphate Nausea And Vomiting    Past Medical History: Past Medical History:  Diagnosis Date  . Anxiety   . Arthritis   . Bipolar 1 disorder ([place])   . Depression   . Hepatitis C 12/05/2015  . Hyperlipidemia     Social History: Social History   Social History  . Marital status: Married    Spouse name: N/A  . Number of children: N/A  . Years of education: N/A   Occupational History  . 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 Smoking status: Former Smoker    Types: Cigarettes    Quit date: 03/02/2011  . Smokeless tobacco: Never Used  . Alcohol use No  . Drug use: No  . Sexual activity: Not on file   Other Topics Concern  . Not on file   Social History Narrative   Works in a [place]: HCV Ab (no units)  Date Value  12/03/2015 REACTIVE (A)    No results found for: HCVGENOTYPE, HEPCGENOTYPE  Hepatitis C RNA quantitative Latest Ref Rng & Units 12/03/2015  HCV Quantitative <15 IU/mL 478,377(H)  HCV Quantitative Log <1.18 log 10 5.68(H)    AST (U/L)  Date Value  12/16/2015 30  12/03/2015 42 (H)  08/15/2014 50 (H)   ALT (U/L)  Date Value  12/16/2015 33  12/16/2015 35  12/03/2015 46  08/15/2014 63 (H)   INR (no units)   Date Value  12/16/2015 1.0    CrCl: CrCl cannot be calculated (Unknown ideal weight.).  Fibrosis Score: F0 as assessed by Fibrosure ([place] pending)   Child-Pugh Score: A  Previous Treatment Regimen: None  Assessment: John Holland is here today for follow-up of his Hep C.  He started taking Harvoni around 7/21. He has not missed any doses of his Harvoni so far and can tell me how many pills he has left in the bottle without looking.  He picked up his second refill today already.  He did ask about his F score, and I explained to him that he had F4.  He had many questions, which were answered.  He had an ultrasound of his liver this morning and asked me to call/email him with the results. I will f/u on the results.  We went over his other medications.  He is not taking anything OTC for heartburn or acid reflux.  We will get a Hep C VL today, and I made a f/u appointment with Dr. Linus Salmons for October.   Plans: - Continue Harvoni 90-400 mg PO once daily x 2 more months - Hep C viral load today - F/u with Dr. Linus Salmons 10/17 at 8:45am  Ashley, BS, PharmD Infectious  [facility] 01/20/2016, 9:32 AM

## 2016-01-22 LAB — HEPATITIS C RNA QUANTITATIVE: HCV Quantitative: NOT DETECTED IU/mL (ref <15)

## 2016-02-11 ENCOUNTER — Other Ambulatory Visit: Payer: Self-pay | Admitting: Adult Health

## 2016-02-11 DIAGNOSIS — F418 Other specified anxiety disorders: Secondary | ICD-10-CM

## 2016-02-11 NOTE — Telephone Encounter (Signed)
Ok to refill 

## 2016-02-12 NOTE — Telephone Encounter (Signed)
Rx called in as directed.   

## 2016-02-16 MED FILL — *HARVONI 90-400 MG TABLET: 90-400 | 28 days supply | Qty: 28 | Fill #2

## 2016-03-15 ENCOUNTER — Other Ambulatory Visit: Payer: Self-pay | Admitting: Internal Medicine

## 2016-03-18 DIAGNOSIS — H2511 Age-related nuclear cataract, right eye: Secondary | ICD-10-CM | POA: Diagnosis not present

## 2016-03-18 DIAGNOSIS — H02834 Dermatochalasis of left upper eyelid: Secondary | ICD-10-CM | POA: Diagnosis not present

## 2016-03-18 DIAGNOSIS — H2512 Age-related nuclear cataract, left eye: Secondary | ICD-10-CM | POA: Diagnosis not present

## 2016-03-18 DIAGNOSIS — D3131 Benign neoplasm of right choroid: Secondary | ICD-10-CM | POA: Diagnosis not present

## 2016-03-22 ENCOUNTER — Other Ambulatory Visit: Payer: Self-pay | Admitting: Adult Health

## 2016-03-22 DIAGNOSIS — F418 Other specified anxiety disorders: Secondary | ICD-10-CM

## 2016-03-22 NOTE — Telephone Encounter (Signed)
Ok to refill 

## 2016-03-23 ENCOUNTER — Ambulatory Visit (INDEPENDENT_AMBULATORY_CARE_PROVIDER_SITE_OTHER): Payer: Medicare Other | Admitting: Internal Medicine

## 2016-03-23 ENCOUNTER — Encounter: Payer: Self-pay | Admitting: Internal Medicine

## 2016-03-23 VITALS — BP 187/82 | HR 56 | Temp 97.6°F | Ht 68.0 in | Wt 140.8 lb

## 2016-03-23 DIAGNOSIS — E782 Mixed hyperlipidemia: Secondary | ICD-10-CM

## 2016-03-23 DIAGNOSIS — K7400 Hepatic fibrosis, unspecified: Secondary | ICD-10-CM | POA: Insufficient documentation

## 2016-03-23 DIAGNOSIS — Z23 Encounter for immunization: Secondary | ICD-10-CM

## 2016-03-23 DIAGNOSIS — B182 Chronic viral hepatitis C: Secondary | ICD-10-CM

## 2016-03-23 NOTE — Assessment & Plan Note (Signed)
Remained on statin and had no issues with myalgias.

## 2016-03-23 NOTE — Telephone Encounter (Signed)
Rx called in as directed.   

## 2016-03-23 NOTE — Progress Notes (Signed)
   Subjective:    Patient ID: John Holland, male    DOB: 05-18-1951, 65 y.o.   MRN: [id number]  HPI Here for hepatitis C follow up.  He has now completed Harvoni 12 weeks with just a mild headache during treatment.  No other issues including no myalgias.  Hepatitis A and B immune.  F4 on Fibrosure but F0/1 on elastography and APRI 0.68 (equivocal).  Pleased with getting treatment.  Early viral load was undetectable.  No alcohol.    Review of Systems  Constitutional: Negative for fatigue.  Skin: Negative for rash.  Neurological: Negative for headaches.       Objective:   Physical Exam  Constitutional: He appears well-developed and well-nourished.  Eyes: No scleral icterus.  Cardiovascular: Normal rate, regular rhythm and normal heart sounds.   Skin: No rash noted.   Social History   Social History  . Marital status: Married    Spouse name: N/A  . Number of children: N/A  . Years of education: N/A   Occupational History  . 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 Smoking status: Former Smoker    Types: Cigarettes    Quit date: 03/02/2011  . Smokeless tobacco: Never Used  . Alcohol use No  . Drug use: No  . Sexual activity: Not on file   Other Topics Concern  . Not on file   Social History Narrative   Works in a [employer]:

## 2016-03-23 NOTE — Assessment & Plan Note (Signed)
EOT lab today and rtc 6 months for SVR 24 and f/u with PharmD.  Will not need further follow up after that.

## 2016-03-23 NOTE — Assessment & Plan Note (Signed)
Results noted and normal albumin and platelets, elastography ok, no cirrhosis on ultrasound.  This is not c/w overt cirrhosis or advanced fibrosis so EGD or [place] screening not indicated.

## 2016-03-24 LAB — HEPATITIS C RNA QUANTITATIVE: HCV Quantitative: NOT DETECTED IU/mL (ref <15)

## 2016-04-26 DIAGNOSIS — H25811 Combined forms of age-related cataract, right eye: Secondary | ICD-10-CM | POA: Diagnosis not present

## 2016-04-26 DIAGNOSIS — H25011 Cortical age-related cataract, right eye: Secondary | ICD-10-CM | POA: Diagnosis not present

## 2016-04-26 DIAGNOSIS — H2511 Age-related nuclear cataract, right eye: Secondary | ICD-10-CM | POA: Diagnosis not present

## 2016-05-05 DIAGNOSIS — H40053 Ocular hypertension, bilateral: Secondary | ICD-10-CM | POA: Diagnosis not present

## 2016-05-18 ENCOUNTER — Other Ambulatory Visit: Payer: Self-pay | Admitting: Adult Health

## 2016-05-18 DIAGNOSIS — F418 Other specified anxiety disorders: Secondary | ICD-10-CM

## 2016-05-18 NOTE — Telephone Encounter (Signed)
Ok to refill for one month  

## 2016-05-18 NOTE — Telephone Encounter (Signed)
Ok to refill 

## 2016-05-20 ENCOUNTER — Telehealth: Payer: Self-pay | Admitting: Adult Health

## 2016-05-20 NOTE — Telephone Encounter (Signed)
° ° ° °  Pt said he contacted the pharmacy and they told him that they do not have the following med     diazepam (VALIUM) 5 MG tablet

## 2016-05-20 NOTE — Telephone Encounter (Signed)
I contacted the patient's pharmacy - they stated that they just checked their voicemail & they are getting the Rx ready right now.  Patient notified and verbalized understanding.

## 2016-07-29 ENCOUNTER — Ambulatory Visit (INDEPENDENT_AMBULATORY_CARE_PROVIDER_SITE_OTHER): Payer: Medicare Other | Admitting: Family Medicine

## 2016-07-29 ENCOUNTER — Encounter: Payer: Self-pay | Admitting: Family Medicine

## 2016-07-29 VITALS — BP 130/88 | HR 70 | Temp 98.1°F | Wt 141.0 lb

## 2016-07-29 DIAGNOSIS — J101 Influenza due to other identified influenza virus with other respiratory manifestations: Secondary | ICD-10-CM

## 2016-07-29 DIAGNOSIS — R52 Pain, unspecified: Secondary | ICD-10-CM

## 2016-07-29 LAB — POCT INFLUENZA A: RAPID INFLUENZA A AGN: POSITIVE

## 2016-07-29 MED ORDER — OSELTAMIVIR PHOSPHATE 75 MG PO CAPS: 75.0000 mg | ORAL_CAPSULE | Freq: Two times a day (BID) | ORAL | 0 refills | Status: AC

## 2016-07-29 NOTE — Patient Instructions (Signed)
Please take tamiflu as directed.  Please drink plenty of water so that your urine is pale yellow or clear. Also, get plenty of rest, use tylenol or ibuprofen as needed for discomfort and follow up if symptoms do not improve in 3 to 4 days, worsen, or you develop a fever >101.  Influenza, Adult Influenza, more commonly known as "the flu," is a viral infection that primarily affects the respiratory tract. The respiratory tract includes organs that help you breathe, such as the lungs, nose, and throat. The flu causes many common cold symptoms, as well as a high fever and body aches. The flu spreads easily from person to person (is contagious). Getting a flu shot (influenza vaccination) every year is the best way to prevent influenza. What are the causes? Influenza is caused by a virus. You can catch the virus by:  Breathing in droplets from an infected person's cough or sneeze.  Touching something that was recently contaminated with the virus and then touching your mouth, nose, or eyes. What increases the risk? The following factors may make you more likely to get the flu:  Not cleaning your hands frequently with soap and water or alcohol-based hand sanitizer.  Having close contact with many people during cold and flu season.  Touching your mouth, eyes, or nose without washing or sanitizing your hands first.  Not drinking enough fluids or not eating a healthy diet.  Not getting enough sleep or exercise.  Being under a high amount of stress.  Not getting a yearly (annual) flu shot. You may be at a higher risk of complications from the flu, such as a severe lung infection (pneumonia), if you:  Are over the age of 76.  Are pregnant.  Have a weakened disease-fighting system (immune system). You may have a weakened immune system if you:  Have HIV or AIDS.  Are undergoing chemotherapy.  Aretaking medicines that reduce the activity of (suppress) the immune system.  Have a long-term  (chronic) illness, such as heart disease, kidney disease, diabetes, or lung disease.  Have a liver disorder.  Are obese.  Have anemia. What are the signs or symptoms? Symptoms of this condition typically last 4-10 days and may include:  Fever.  Chills.  Headache, body aches, or muscle aches.  Sore throat.  Cough.  Runny or congested nose.  Chest discomfort and cough.  Poor appetite.  Weakness or tiredness (fatigue).  Dizziness.  Nausea or vomiting. How is this diagnosed? This condition may be diagnosed based on your medical history and a physical exam. Your health care provider may do a nose or throat swab test to confirm the diagnosis. How is this treated? If influenza is detected early, you can be treated with antiviral medicine that can reduce the length of your illness and the severity of your symptoms. This medicine may be given by mouth (orally) or through an IV tube that is inserted in one of your veins. The goal of treatment is to relieve symptoms by taking care of yourself at home. This may include taking over-the-counter medicines, drinking plenty of fluids, and adding humidity to the air in your home. In some cases, influenza goes away on its own. Severe influenza or complications from influenza may be treated in a hospital. Follow these instructions at home:  Take over-the-counter and prescription medicines only as told by your health care provider.  Use a cool mist humidifier to add humidity to the air in your home. This can make breathing easier.  Rest  as needed.  Drink enough fluid to keep your urine clear or pale yellow.  Cover your mouth and nose when you cough or sneeze.  Wash your hands with soap and water often, especially after you cough or sneeze. If soap and water are not available, use hand sanitizer.  Stay home from work or school as told by your health care provider. Unless you are visiting your health care provider, try to avoid leaving  home until your fever has been gone for 24 hours without the use of medicine.  Keep all follow-up visits as told by your health care provider. This is important. How is this prevented?  Getting an annual flu shot is the best way to avoid getting the flu. You may get the flu shot in late summer, fall, or winter. Ask your health care provider when you should get your flu shot.  Wash your hands often or use hand sanitizer often.  Avoid contact with people who are sick during cold and flu season.  Eat a healthy diet, drink plenty of fluids, get enough sleep, and exercise regularly. Contact a health care provider if:  You develop new symptoms.  You have:  Chest pain.  Diarrhea.  A fever.  Your cough gets worse.  You produce more mucus.  You feel nauseous or you vomit. Get help right away if:  You develop shortness of breath or difficulty breathing.  Your skin or nails turn a bluish color.  You have severe pain or stiffness in your neck.  You develop a sudden headache or sudden pain in your face or ear.  You cannot stop vomiting. This information is not intended to replace advice given to you by your health care provider. Make sure you discuss any questions you have with your health care provider. Document Released: 05/21/2000 Document Revised: 10/30/2015 Document Reviewed: 03/18/2015 Elsevier Interactive Patient Education  2017 [employer].

## 2016-07-29 NOTE — Progress Notes (Signed)
Pre visit review using our clinic review tool, if applicable. No additional management support is needed unless otherwise documented below in the visit note. 

## 2016-07-29 NOTE — Progress Notes (Signed)
Patient ID: John Holland, male   DOB: 21-Nov-1950, 66 y.o.   MRN: [id number]  PCP: Dorothyann Peng, NP  Subjective:  John Holland is a 66 y.o. year old very pleasant male patient who presents with flu like symptoms including fever,body aches.  -other symptoms: chills, headaches, rhinitis, and nasal congestion, prior nausea but has improved and not present today -started: 48 hours ago -inside 48 hour treatment window if needed for tamiflu: yes -high risk condition:  Adult >65 -symptoms:  No change -previous treatments: Alka Seltzer cold provided limited benefit - patient did  receive flu shot this year.  - No sick contact; specifically influenza  ROS-denies SOB, NVD, sinus or dental pain  Pertinent Past Medical History-  Patient Active Problem List   Diagnosis Date Noted  . Liver fibrosis ([place]) 03/23/2016  . Chronic hepatitis C without hepatic coma ([place]) 12/17/2015  . Hyperlipidemia 09/11/2008  . ERECTILE DYSFUNCTION 09/11/2008  . 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 Depression with anxiety 01/26/2007  . OSTEOARTHRITIS 01/26/2007    Medications- reviewed  Current Outpatient Prescriptions  Medication Sig Dispense Refill  . aspirin 81 MG tablet Take 81 mg by mouth daily.    . diazepam (VALIUM) 5 MG tablet TAKE 1 TABLET BY MOUTH THREE TIMES DAILY AS NEEDED 90 tablet 0  . simvastatin (ZOCOR) 20 MG tablet Take 1 tablet (20 mg total) by mouth at bedtime. 90 tablet 3  . QUEtiapine (SEROQUEL) 50 MG tablet Take 50 mg by mouth at bedtime.     No current facility-administered medications for this visit.     Objective: BP 130/88 (BP Location: Left Arm, Patient Position: Sitting, Cuff Size: Normal)   Pulse 70   Temp 98.1 F (36.7 C) (Oral)   Wt 141 lb (64 kg)   SpO2 97%   BMI 21.44 kg/m  Gen: NAD, appears fatigued HEENT: Turbinates erythematous, TM normal, pharynx mildly erythematous with no tonsilar exudate or edema, no sinus tenderness CV: RRR no murmurs rubs or gallops Lungs: CTAB  no crackles, wheeze, rhonchi Abdomen: soft/nontender/nondistended/normal bowel sounds. Ext: no edema Skin: warm, dry, no rash  Results for orders placed or performed in visit on 07/29/16 (from the past 24 hour(s))  POCT Influenza A     Status: None   Collection Time: 07/29/16  8:47 AM  Result Value Ref Range   Rapid Influenza A Ag positive     Assessment/Plan:  Flu-like illness:  History and exam today are suggestive of viral process. Patients influenza test was positive for type A.  High risk condition: Adult >65  Patient will be treated with Tamiflu. Prophylaxis for other [facility] patients: No Symptomatic treatment with: Advised patient on supportive measures:  Get rest, drink plenty of fluids, and use tylenol or ibuprofen as needed for pain. Follow up if fever >101, if symptoms worsen or if symptoms are not improved in 3 days. Patient verbalizes understanding.   Finally, we reviewed reasons to return to care including if symptoms worsen or persist or new concerns arise.   Laurita Quint, FNP

## 2016-08-20 ENCOUNTER — Other Ambulatory Visit: Payer: Self-pay | Admitting: Adult Health

## 2016-08-20 DIAGNOSIS — F418 Other specified anxiety disorders: Secondary | ICD-10-CM

## 2016-08-20 NOTE — Telephone Encounter (Signed)
Ok to refill 

## 2016-08-23 NOTE — Telephone Encounter (Signed)
° ° °  Pt call to fup on refill  diazepam (VALIUM) 5 MG tablet

## 2016-08-24 NOTE — Telephone Encounter (Signed)
Rx called in as directed.   

## 2016-08-25 ENCOUNTER — Ambulatory Visit (INDEPENDENT_AMBULATORY_CARE_PROVIDER_SITE_OTHER): Payer: Medicare Other

## 2016-08-25 ENCOUNTER — Ambulatory Visit (INDEPENDENT_AMBULATORY_CARE_PROVIDER_SITE_OTHER): Payer: Medicare Other | Admitting: Podiatry

## 2016-08-25 ENCOUNTER — Encounter: Payer: Self-pay | Admitting: Podiatry

## 2016-08-25 VITALS — BP 123/77 | HR 85 | Resp 16

## 2016-08-25 DIAGNOSIS — M79671 Pain in right foot: Secondary | ICD-10-CM | POA: Diagnosis not present

## 2016-08-25 DIAGNOSIS — M722 Plantar fascial fibromatosis: Secondary | ICD-10-CM

## 2016-08-25 MED ORDER — METHYLPREDNISOLONE 4 MG PO TBPK: ORAL_TABLET | Freq: Four times a day (QID) | ORAL | 0 refills | Status: DC

## 2016-08-25 MED ORDER — MELOXICAM 15 MG PO TABS: 15.0000 mg | ORAL_TABLET | Freq: Every day | ORAL | 2 refills | Status: DC

## 2016-09-01 MED ORDER — BETAMETHASONE SOD PHOS & ACET 6 (3-3) MG/ML IJ SUSP: 3.0000 mg | Freq: Once | INTRAMUSCULAR | Status: DC

## 2016-09-01 NOTE — Patient Instructions (Signed)
[id number]

## 2016-09-01 NOTE — Progress Notes (Signed)
   Subjective: Patient presents today for pain and tenderness in the right foot. Patient states the foot pain has been hurting for several weeks now. Patient states that it hurts in the mornings with the first steps out of bed. Patient states that the pain is been ongoing for 4-5 months now and he works on concrete floors all day long. Patient artery has custom molded inserts. He states that he still continues to get pain though.  Objective: Physical Exam General: The patient is alert and oriented x3 in no acute distress.  Dermatology: Skin is warm, dry and supple bilateral lower extremities. Negative for open lesions or macerations bilateral.   Vascular: Dorsalis Pedis and Posterior Tibial pulses palpable bilateral.  Capillary fill time is immediate to all digits.  Neurological: Epicritic and protective threshold intact bilateral.   Musculoskeletal: Tenderness to palpation at the medial calcaneal tubercale and through the insertion of the plantar fascia of the right foot. All other joints range of motion within normal limits bilateral. Strength 5/5 in all groups bilateral.   Radiographic exam: Normal osseous mineralization. Joint spaces preserved. No fracture/dislocation/boney destruction. Calcaneal spur present with mild thickening of plantar fascia right. No other soft tissue abnormalities or radiopaque foreign bodies.   Assessment: 1. Plantar fasciitis right 2. Pain in right foot  Plan of Care:  1. Patient evaluated. Xrays reviewed.   2. Injection of 0.5cc Celestone soluspan injected into the right heel at the insertion of the plantar fascia.  3. Instructed patient regarding therapies and modalities at home to alleviate symptoms.  4. Rx for Meloxicam 15mg  PO dispensed   5. Prescription for Medrol Dosepak 6. Plantar fascial band(s) dispensed. 7. Continue custom molded orthotics that the patient artery has. 8. Return to clinic in 4 weeks.     Edrick Kins, DPM [facility]  Dr. Edrick Kins, [address]                                        [place], [place] [address]                Office [phone]  Fax [phone]

## 2016-09-07 ENCOUNTER — Other Ambulatory Visit: Payer: Medicare Other

## 2016-09-07 DIAGNOSIS — B182 Chronic viral hepatitis C: Secondary | ICD-10-CM | POA: Diagnosis not present

## 2016-09-07 LAB — COMPLETE METABOLIC PANEL WITH GFR
ALBUMIN: 4.2 g/dL (ref 3.6–5.1)
ALT: 12 U/L (ref 9–46)
AST: 18 U/L (ref 10–35)
Alkaline Phosphatase: 52 U/L (ref 40–115)
BUN: 14 mg/dL (ref 7–25)
CO2: 19 mmol/L — AB (ref 20–31)
Calcium: 9.4 mg/dL (ref 8.6–10.3)
Chloride: 108 mmol/L (ref 98–110)
Creat: 1.04 mg/dL (ref 0.70–1.25)
GFR, EST NON AFRICAN AMERICAN: 74 mL/min (ref >=60)
GFR, Est African American: 86 mL/min (ref >=60)
GLUCOSE: 97 mg/dL (ref 65–99)
Potassium: 4.4 mmol/L (ref 3.5–5.3)
SODIUM: 143 mmol/L (ref 135–146)
TOTAL PROTEIN: 6.7 g/dL (ref 6.1–8.1)
Total Bilirubin: 0.5 mg/dL (ref 0.2–1.2)

## 2016-09-09 LAB — HEPATITIS C RNA QUANTITATIVE
HCV QUANT LOG: NOT DETECTED {Log_IU}/mL
HCV Quantitative: <15 IU/mL

## 2016-09-21 ENCOUNTER — Encounter: Payer: Self-pay | Admitting: Internal Medicine

## 2016-09-21 ENCOUNTER — Ambulatory Visit (INDEPENDENT_AMBULATORY_CARE_PROVIDER_SITE_OTHER): Payer: Medicare Other | Admitting: Internal Medicine

## 2016-09-21 DIAGNOSIS — B182 Chronic viral hepatitis C: Secondary | ICD-10-CM

## 2016-09-21 DIAGNOSIS — K7400 Hepatic fibrosis, unspecified: Secondary | ICD-10-CM

## 2016-09-21 NOTE — Assessment & Plan Note (Signed)
Now considered cured.  Reminded him that Ab will remain positive so will not be able to donate blood.   rtc prn

## 2016-09-21 NOTE — Assessment & Plan Note (Signed)
No indication for [place] screening.  Knows not to drink alcohol.

## 2016-09-21 NOTE — Progress Notes (Signed)
   Subjective:    Patient ID: EFREM PITSTICK, male    DOB: 04/16/1951, 66 y.o.   MRN: [id number]  HPI Here for follow up of hepatitis C.  Completed 12 weeks of Harvoni and no issues.  EOT viral load negative and now SVR 24 negative confirming cure.  No new issues.  F4 on Fibrosure but F0/1 on elastography and normal platelets and albumin.     Review of Systems  Neurological: Negative for headaches.       Objective:   Physical Exam  Constitutional: He appears well-developed and well-nourished.  Eyes: No scleral icterus.  Cardiovascular: Normal rate, regular rhythm and normal heart sounds.   Skin: No rash noted.          Assessment & Plan:

## 2016-09-22 ENCOUNTER — Encounter: Payer: Self-pay | Admitting: Podiatry

## 2016-09-22 ENCOUNTER — Ambulatory Visit (INDEPENDENT_AMBULATORY_CARE_PROVIDER_SITE_OTHER): Payer: Medicare Other | Admitting: Podiatry

## 2016-09-22 ENCOUNTER — Other Ambulatory Visit: Payer: Self-pay | Admitting: Podiatry

## 2016-09-22 DIAGNOSIS — M722 Plantar fascial fibromatosis: Secondary | ICD-10-CM

## 2016-09-22 DIAGNOSIS — M79671 Pain in right foot: Secondary | ICD-10-CM | POA: Diagnosis not present

## 2016-09-23 NOTE — Progress Notes (Signed)
   Subjective: Patient presents today for follow-up for pain and tenderness in the right foot. He states his pain is better and has been wearing the fascial brace which is helpful. He is still experiencing some tenderness. He denies any new complaints or trauma.  Objective: Physical Exam General: The patient is alert and oriented x3 in no acute distress.  Dermatology: Skin is warm, dry and supple bilateral lower extremities. Negative for open lesions or macerations bilateral.   Vascular: Dorsalis Pedis and Posterior Tibial pulses palpable bilateral.  Capillary fill time is immediate to all digits.  Neurological: Epicritic and protective threshold intact bilateral.   Musculoskeletal: Tenderness to palpation at the medial calcaneal tubercale and through the insertion of the plantar fascia of the right foot. All other joints range of motion within normal limits bilateral. Strength 5/5 in all groups bilateral.    Assessment: 1. Plantar fasciitis right 2. Pain in right foot  Plan of Care:  1. Patient evaluated.  2. Injection of 0.5cc Celestone soluspan injected into the right heel at the insertion of the plantar fascia.  3. Instructed patient regarding therapies and modalities at home to alleviate symptoms.  4. Continue meloxicam   5. Prescription for Medrol Dosepak 6. Plantar fascial band dispensed for left foot. 7. Return to clinic in 4 weeks.   Edrick Kins, DPM [facility]  Dr. Edrick Kins, [address]                                        [place], [place] [address]                Office [phone]  Fax [phone]

## 2016-09-27 ENCOUNTER — Other Ambulatory Visit: Payer: Self-pay | Admitting: Adult Health

## 2016-09-27 DIAGNOSIS — F418 Other specified anxiety disorders: Secondary | ICD-10-CM

## 2016-09-27 NOTE — Telephone Encounter (Signed)
Ok to refill for 30 days  

## 2016-10-03 MED ORDER — BETAMETHASONE SOD PHOS & ACET 6 (3-3) MG/ML IJ SUSP: 3.0000 mg | Freq: Once | INTRAMUSCULAR | Status: DC

## 2016-10-20 ENCOUNTER — Ambulatory Visit: Payer: Medicare Other | Admitting: Podiatry

## 2016-10-25 ENCOUNTER — Ambulatory Visit (INDEPENDENT_AMBULATORY_CARE_PROVIDER_SITE_OTHER): Payer: Medicare Other | Admitting: Podiatry

## 2016-10-25 ENCOUNTER — Encounter: Payer: Self-pay | Admitting: Podiatry

## 2016-10-25 DIAGNOSIS — M722 Plantar fascial fibromatosis: Secondary | ICD-10-CM

## 2016-10-26 NOTE — Progress Notes (Signed)
   Subjective: Patient presents today for follow-up evaluation of pain and tenderness in the right foot. She has been wearing the brace which has provided significant relief of the pain.  Objective: Physical Exam General: The patient is alert and oriented x3 in no acute distress.  Dermatology: Skin is warm, dry and supple bilateral lower extremities. Negative for open lesions or macerations bilateral.   Vascular: Dorsalis Pedis and Posterior Tibial pulses palpable bilateral.  Capillary fill time is immediate to all digits.  Neurological: Epicritic and protective threshold intact bilateral.   Musculoskeletal: Tenderness to palpation at the medial calcaneal tubercale and through the insertion of the plantar fascia of the right foot. All other joints range of motion within normal limits bilateral. Strength 5/5 in all groups bilateral.    Assessment: 1. Plantar fasciitis right 2. Pain in right foot  Plan of Care:  1. Patient evaluated.  2. Injection of 0.5cc Celestone soluspan injected into the right heel at the insertion of the plantar fascia.  3. Instructed patient regarding therapies and modalities at home to alleviate symptoms.  4. Appointment with Liliane Channel for custom orthotics pending insurance coverage. 5. Return to clinic when necessary.  Edrick Kins, DPM [facility]  Dr. Edrick Kins, [address]                                        [place], [place] [address]                Office [phone]  Fax [phone]

## 2016-10-28 MED ORDER — BETAMETHASONE SOD PHOS & ACET 6 (3-3) MG/ML IJ SUSP: 3.0000 mg | Freq: Once | INTRAMUSCULAR | Status: DC

## 2016-11-11 ENCOUNTER — Other Ambulatory Visit: Payer: Self-pay | Admitting: Adult Health

## 2016-11-11 DIAGNOSIS — F418 Other specified anxiety disorders: Secondary | ICD-10-CM

## 2016-11-12 NOTE — Telephone Encounter (Signed)
He can have an additional refill. Please inform him that he needs to follow up for his yearly exam for any other refills past this one

## 2016-11-12 NOTE — Telephone Encounter (Signed)
Pt is requesting a refill on: Diazepam 5 mg take one tablet PO TID PRN.  Last refilled: 09/28/16 #90 with 0 refills Last OV with Julia,NP: 07/29/16 Last OV with Cory,NP: 12/03/15  Please advise

## 2016-11-21 ENCOUNTER — Other Ambulatory Visit: Payer: Self-pay | Admitting: Podiatry

## 2016-11-22 NOTE — Telephone Encounter (Signed)
Pt needs an appt prior to future refills. 

## 2016-12-07 ENCOUNTER — Encounter: Payer: Self-pay | Admitting: Adult Health

## 2016-12-07 ENCOUNTER — Ambulatory Visit (INDEPENDENT_AMBULATORY_CARE_PROVIDER_SITE_OTHER): Payer: Medicare Other | Admitting: Adult Health

## 2016-12-07 VITALS — BP 140/80 | HR 60 | Temp 98.0°F | Ht 68.0 in | Wt 143.4 lb

## 2016-12-07 DIAGNOSIS — N400 Enlarged prostate without lower urinary tract symptoms: Secondary | ICD-10-CM

## 2016-12-07 DIAGNOSIS — E782 Mixed hyperlipidemia: Secondary | ICD-10-CM

## 2016-12-07 DIAGNOSIS — R030 Elevated blood-pressure reading, without diagnosis of hypertension: Secondary | ICD-10-CM

## 2016-12-07 DIAGNOSIS — Z23 Encounter for immunization: Secondary | ICD-10-CM

## 2016-12-07 LAB — CBC WITH DIFFERENTIAL/PLATELET
BASOS PCT: 1.4 % (ref 0.0–3.0)
Basophils Absolute: 0.1 10*3/uL (ref 0.0–0.1)
EOS ABS: 0.1 10*3/uL (ref 0.0–0.7)
Eosinophils Relative: 2 % (ref 0.0–5.0)
HEMATOCRIT: 42.6 % (ref 39.0–52.0)
Hemoglobin: 14.7 g/dL (ref 13.0–17.0)
LYMPHS ABS: 1.8 10*3/uL (ref 0.7–4.0)
LYMPHS PCT: 28.5 % (ref 12.0–46.0)
MCHC: 34.5 g/dL (ref 30.0–36.0)
MCV: 92.4 fl (ref 78.0–100.0)
Monocytes Absolute: 0.6 10*3/uL (ref 0.1–1.0)
Monocytes Relative: 9.4 % (ref 3.0–12.0)
NEUTROS ABS: 3.6 10*3/uL (ref 1.4–7.7)
Neutrophils Relative %: 58.7 % (ref 43.0–77.0)
PLATELETS: 210 10*3/uL (ref 150.0–400.0)
RBC: 4.62 Mil/uL (ref 4.22–5.81)
RDW: 12.5 % (ref 11.5–15.5)
WBC: 6.2 10*3/uL (ref 4.0–10.5)

## 2016-12-07 LAB — HEPATIC FUNCTION PANEL
ALBUMIN: 4.7 g/dL (ref 3.5–5.2)
ALT: 13 U/L (ref 0–53)
AST: 17 U/L (ref 0–37)
Alkaline Phosphatase: 56 U/L (ref 39–117)
BILIRUBIN DIRECT: 0.1 mg/dL (ref 0.0–0.3)
TOTAL PROTEIN: 7.1 g/dL (ref 6.0–8.3)
Total Bilirubin: 0.6 mg/dL (ref 0.2–1.2)

## 2016-12-07 LAB — BASIC METABOLIC PANEL
BUN: 13 mg/dL (ref 6–23)
CHLORIDE: 107 meq/L (ref 96–112)
CO2: 28 meq/L (ref 19–32)
CREATININE: 1.03 mg/dL (ref 0.40–1.50)
Calcium: 9.6 mg/dL (ref 8.4–10.5)
GFR: 76.64 mL/min (ref >60.00)
GLUCOSE: 99 mg/dL (ref 70–99)
Potassium: 4.6 mEq/L (ref 3.5–5.1)
Sodium: 142 mEq/L (ref 135–145)

## 2016-12-07 LAB — LIPID PANEL
CHOL/HDL RATIO: 3
Cholesterol: 157 mg/dL (ref 0–200)
HDL: 47.8 mg/dL (ref >39.00)
LDL Cholesterol: 89 mg/dL (ref 0–99)
NonHDL: 109.31
TRIGLYCERIDES: 102 mg/dL (ref 0.0–149.0)
VLDL: 20.4 mg/dL (ref 0.0–40.0)

## 2016-12-07 LAB — PSA: PSA: 2.35 ng/mL (ref 0.10–4.00)

## 2016-12-07 LAB — TSH: TSH: 1.02 u[IU]/mL (ref 0.35–4.50)

## 2016-12-07 NOTE — Progress Notes (Signed)
Subjective:    Patient ID: John Holland, male    DOB: [birth year], 66 y.o.   MRN: [id number]  HPI  Patient presents for yearly preventative medicine examination. He is a pleasant 66 year old male who  has a past medical history of Anxiety; Arthritis; Bipolar 1 disorder ([place]); Depression; Hepatitis C (12/05/2015); and Hyperlipidemia.  All immunizations and health maintenance protocols were reviewed with the patient and needed orders were placed. He is due for Prevnar 23.   Appropriate screening laboratory values were ordered for the patient including screening of hyperlipidemia, renal function and hepatic function. If indicated by BPH, a PSA was ordered.  Medication reconciliation,  past medical history, social history, problem list and allergies were reviewed in detail with the patient  Goals were established with regard to weight loss, exercise, and  diet in compliance with medications. He tries to eat healthy and is active   End of life planning was discussed.  He is up to date on his colonoscopy, dental and vision care.   Currently he takes Simvastatin 20 mg for hyperlipidemia   He has completed Hep C treatment with [facility] and is now considered cured.   BP Readings from Last 3 Encounters:  12/07/16 140/80  09/21/16 (!) 179/87  08/25/16 123/77   He denies any acute complaints today   Review of Systems  Constitutional: Negative.   HENT: Negative.   Eyes: Negative.   Respiratory: Negative.   Cardiovascular: Negative.   Gastrointestinal: Negative.   Endocrine: Negative.   Genitourinary: Negative.   Musculoskeletal: Negative.   Skin: Negative.   Allergic/Immunologic: Negative.   Neurological: Negative.   Hematological: Negative.   Psychiatric/Behavioral: Negative.   All other systems reviewed and are negative.  Past Medical History:  Diagnosis Date  . Anxiety   . Arthritis   . Bipolar 1 disorder ([place])   . Depression   . Hepatitis C 12/05/2015  .  Hyperlipidemia     Social History   Social History  . Marital status: Married    Spouse name: N/A  . Number of children: N/A  . Years of education: N/A   Occupational History  . 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 Smoking status: Former Smoker    Types: Cigarettes    Quit date: 03/02/2011  . Smokeless tobacco: Never Used  . Alcohol use No  . Drug use: No  . Sexual activity: Not on file   Other Topics Concern  . Not on file   Social History Narrative   Works in a Publishing rights manager     Past Surgical History:  Procedure Laterality Date  . ANKLE FRACTURE SURGERY     age 9-lt  . arthroscopy arm  2003   right elbow bone frag  . COLONOSCOPY    . ROTATOR CUFF REPAIR Right 2015?    Family History  Problem Relation Age of Onset  . Colon cancer Neg Hx   . Stomach cancer Neg Hx   . COPD Mother   . Heart disease Mother     Allergies  Allergen Reactions  . Codeine Phosphate Nausea And Vomiting    Current Outpatient Prescriptions on File Prior to Visit  Medication Sig Dispense Refill  . aspirin 81 MG tablet Take 81 mg by mouth daily.    . diazepam (VALIUM) 5 MG tablet TAKE 1 TABLET BY MOUTH THREE TIMES DAILY AS NEEDED 90 tablet 0  . meloxicam (MOBIC) 15 MG tablet TAKE  1 TABLET BY MOUTH EVERY DAY 30 tablet 0  . simvastatin (ZOCOR) 20 MG tablet Take 1 tablet (20 mg total) by mouth at bedtime. 90 tablet 3   Current Facility-Administered Medications on File Prior to Visit  Medication Dose Route Frequency Provider Last Rate Last Dose  . betamethasone acetate-betamethasone sodium phosphate (CELESTONE) injection 3 mg  3 mg Intramuscular Once Daylene Katayama M, DPM      . betamethasone acetate-betamethasone sodium phosphate (CELESTONE) injection 3 mg  3 mg Intramuscular Once Evans, Brent M, DPM      . betamethasone acetate-betamethasone sodium phosphate (CELESTONE) injection 3 mg  3 mg Intramuscular Once Evans, Brent M, DPM        BP  140/80 (BP Location: Left Arm, Patient Position: Sitting, Cuff Size: Normal)   Pulse 60   Temp 98 F (36.7 C) (Oral)   Ht 5\' 8"  (1.727 m)   Wt 143 lb 6.4 oz (65 kg)   SpO2 97%   BMI 21.80 kg/m       Objective:   Physical Exam  Constitutional: He is oriented to person, place, and time. He appears well-developed and well-nourished. No distress.  HENT:  Head: Normocephalic and atraumatic.  Right Ear: External ear normal.  Left Ear: External ear normal.  Nose: Nose normal.  Mouth/Throat: Oropharynx is clear and moist. No oropharyngeal exudate.  Eyes: Conjunctivae and EOM are normal. Pupils are equal, round, and reactive to light. Right eye exhibits no discharge. Left eye exhibits no discharge. No scleral icterus.  Neck: Normal range of motion. Neck supple. No JVD present. Carotid bruit is not present. No tracheal deviation present. No thyroid mass and no thyromegaly present.  Cardiovascular: Normal rate, regular rhythm, normal heart sounds and intact distal pulses.  Exam reveals no gallop and no friction rub.   No murmur heard. Pulmonary/Chest: Effort normal and breath sounds normal. No stridor. No respiratory distress. He has no wheezes. He has no rales. He exhibits no tenderness.  Abdominal: Soft. Bowel sounds are normal. He exhibits no distension and no mass. There is no tenderness. There is no rebound and no guarding.  Genitourinary: Rectum normal. Prostate is enlarged.  Musculoskeletal: Normal range of motion. He exhibits no edema, tenderness or deformity.  Lymphadenopathy:    He has no cervical adenopathy.  Neurological: He is alert and oriented to person, place, and time. He has normal reflexes. He displays normal reflexes. No cranial nerve deficit. He exhibits normal muscle tone. Coordination normal.  Skin: Skin is warm and dry. No rash noted. He is not diaphoretic. No erythema. No pallor.  Psychiatric: He has a normal mood and affect. His behavior is normal. Judgment and thought  content normal.  Nursing note and vitals reviewed.     Assessment & Plan:  1. Mixed hyperlipidemia - Educated on the importance of diet and exercise  - Consider increasing simvastatin  - Basic metabolic panel - CBC with Differential/Platelet - Hepatic function panel - Lipid panel - TSH - PSA  2. Elevated blood pressure reading - Slightly elevated today in the office. I am going to to have him monitor his BP at home for the next week and have him send me the results via MyChart  - Consider adding Ace or Arb  - Basic metabolic panel - CBC with Differential/Platelet - Hepatic function panel - Lipid panel - TSH - PSA  3. Benign prostatic hyperplasia without lower urinary tract symptoms  - PSA   Dorothyann Peng, NP

## 2016-12-07 NOTE — Patient Instructions (Addendum)
    It was great seeing you today!   I will follow up with you regarding your lab work.   Please monitor your blood pressure twice a day ( morning and night) and send me the results via MyChart

## 2016-12-07 NOTE — Addendum Note (Signed)
Addended by: Abelardo Diesel on: 12/07/2016 07:55 AM   Modules accepted: Orders

## 2016-12-20 ENCOUNTER — Other Ambulatory Visit: Payer: Self-pay | Admitting: Adult Health

## 2016-12-20 DIAGNOSIS — F418 Other specified anxiety disorders: Secondary | ICD-10-CM

## 2016-12-21 NOTE — Telephone Encounter (Signed)
Ok to refill for 30 days  

## 2016-12-21 NOTE — Telephone Encounter (Signed)
Last OV: 12/07/16 Last refill: 11/12/16 #90 with 0 refills.  Ok to refill? Please advise.

## 2016-12-23 ENCOUNTER — Other Ambulatory Visit: Payer: Self-pay | Admitting: Podiatry

## 2016-12-31 ENCOUNTER — Other Ambulatory Visit: Payer: Self-pay | Admitting: Adult Health

## 2016-12-31 DIAGNOSIS — E785 Hyperlipidemia, unspecified: Secondary | ICD-10-CM

## 2016-12-31 NOTE — Telephone Encounter (Signed)
Sent to the pharmacy by e-scribe for 1 year.  Pt had yearly on 12/07/16 and lab work.

## 2017-01-19 ENCOUNTER — Other Ambulatory Visit: Payer: Self-pay | Admitting: Podiatry

## 2017-01-20 NOTE — Telephone Encounter (Signed)
Dr Amalia Hailey, ok to refill meloxicam?

## 2017-01-20 NOTE — Telephone Encounter (Signed)
Yes, that is fine. Thanks. 

## 2017-01-26 ENCOUNTER — Other Ambulatory Visit: Payer: Self-pay | Admitting: Adult Health

## 2017-01-26 DIAGNOSIS — F418 Other specified anxiety disorders: Secondary | ICD-10-CM

## 2017-01-26 NOTE — Telephone Encounter (Signed)
Last filled 12/21/16 #90 Last seen on 12/07/16 No future appt scheduled Please advise.  Thanks!!

## 2017-01-26 NOTE — Telephone Encounter (Signed)
Ok to refill for 30 days  

## 2017-01-27 NOTE — Telephone Encounter (Signed)
Called to the pharmacy and left on machine. 

## 2017-02-25 ENCOUNTER — Encounter: Payer: Self-pay | Admitting: Adult Health

## 2017-03-01 ENCOUNTER — Other Ambulatory Visit: Payer: Self-pay | Admitting: Adult Health

## 2017-03-01 DIAGNOSIS — F418 Other specified anxiety disorders: Secondary | ICD-10-CM

## 2017-03-02 NOTE — Telephone Encounter (Signed)
Last filled on 01/27/17 #90 Last seen on 12/07/16 No future appointment scheduled Please advise.  Thanks!!

## 2017-03-03 ENCOUNTER — Other Ambulatory Visit: Payer: Self-pay | Admitting: Adult Health

## 2017-03-03 DIAGNOSIS — F418 Other specified anxiety disorders: Secondary | ICD-10-CM

## 2017-03-03 NOTE — Telephone Encounter (Signed)
Ok to refill for 3 months.  

## 2017-03-03 NOTE — Telephone Encounter (Signed)
Called to the pharmacy and left on machine. 

## 2017-03-04 NOTE — Telephone Encounter (Signed)
John Holland called in to pharmacy

## 2017-03-07 ENCOUNTER — Telehealth: Payer: Self-pay | Admitting: *Deleted

## 2017-03-07 NOTE — Telephone Encounter (Signed)
Patient's wife called with patient's blood pressure readings   03/03/17 6:20pm 177/97 03/04/17 9:33pm 164/88 03/05/17 9:29am 160/82 03/06/17 142/85  Patient has not checked blood pressure today. Please advise [phone]

## 2017-03-09 ENCOUNTER — Other Ambulatory Visit: Payer: Self-pay | Admitting: Adult Health

## 2017-03-09 MED ORDER — LISINOPRIL 10 MG PO TABS: 10.0000 mg | ORAL_TABLET | Freq: Every day | ORAL | 1 refills | Status: DC

## 2017-03-09 NOTE — Telephone Encounter (Signed)
Cory patient  

## 2017-03-09 NOTE — Telephone Encounter (Signed)
Lisinopril 10 mg was called in. Please follow up in 1-2 weeks

## 2017-03-09 NOTE — Telephone Encounter (Signed)
Left a message for a return call.

## 2017-03-10 NOTE — Telephone Encounter (Signed)
Spoke to Kimmell and informed her that rx was sent in.  She informed me that the pt has picked it up from the pharmacy.  Scheduled OV on 03/29/17 @ 7 AM.

## 2017-03-11 NOTE — Telephone Encounter (Signed)
See telephone note form 03/07/17.  Pt has picked up 30 day supply.  Coming back to see Tommi Rumps on 03/29/17.  Can refill for 90 days at that time.

## 2017-03-29 ENCOUNTER — Ambulatory Visit: Payer: Medicare Other | Admitting: Adult Health

## 2017-04-01 DIAGNOSIS — L218 Other seborrheic dermatitis: Secondary | ICD-10-CM | POA: Diagnosis not present

## 2017-04-01 DIAGNOSIS — L853 Xerosis cutis: Secondary | ICD-10-CM | POA: Diagnosis not present

## 2017-04-01 DIAGNOSIS — B078 Other viral warts: Secondary | ICD-10-CM | POA: Diagnosis not present

## 2017-04-01 DIAGNOSIS — I788 Other diseases of capillaries: Secondary | ICD-10-CM | POA: Diagnosis not present

## 2017-04-01 DIAGNOSIS — L821 Other seborrheic keratosis: Secondary | ICD-10-CM | POA: Diagnosis not present

## 2017-04-08 ENCOUNTER — Encounter: Payer: Self-pay | Admitting: Adult Health

## 2017-04-08 ENCOUNTER — Ambulatory Visit (INDEPENDENT_AMBULATORY_CARE_PROVIDER_SITE_OTHER): Payer: Medicare Other | Admitting: Adult Health

## 2017-04-08 VITALS — BP 136/74 | Temp 98.2°F | Wt 146.0 lb

## 2017-04-08 DIAGNOSIS — I10 Essential (primary) hypertension: Secondary | ICD-10-CM | POA: Diagnosis not present

## 2017-04-08 DIAGNOSIS — Z23 Encounter for immunization: Secondary | ICD-10-CM

## 2017-04-08 MED ORDER — LISINOPRIL 10 MG PO TABS: 10.0000 mg | ORAL_TABLET | Freq: Every day | ORAL | 3 refills | Status: DC

## 2017-04-08 NOTE — Progress Notes (Signed)
Subjective:    Patient ID: John Holland, male    DOB: 1951-04-27, 66 y.o.   MRN: [id number]  HPI  66 year old male who  has a past medical history of Anxiety; Arthritis; Bipolar 1 disorder ([place]); Depression; Hepatitis C (12/05/2015); and Hyperlipidemia. He presents to the office today for follow up after starting lisinopril. He was last seen in July 2018, at which time his blood pressure in the office was slightly elevated in the 562'Z systolic. He was advised to monitor at home and send me his results via mychart. Three months later, I was sent blood pressure readings in which showed   03/03/17 6:20pm 177/97 03/04/17 9:33pm 164/88 03/05/17 9:29am 160/82 03/06/17 142/85  He was started on lisinopril 10 mg at this time.   Today in the office he reports that he has not experienced any side effects such as headache, blurred vision, lightheadedness, dry cough or angio edema.   He has not been checking his blood pressure at home   Review of Systems  Constitutional: Negative.   HENT: Negative.   Eyes: Negative.   Respiratory: Negative.   Cardiovascular: Negative.   Gastrointestinal: Negative.   Neurological: Negative.   All other systems reviewed and are negative.  Past Medical History:  Diagnosis Date  . Anxiety   . Arthritis   . Bipolar 1 disorder ([place])   . Depression   . Hepatitis C 12/05/2015  . Hyperlipidemia     Social History   Social History  . Marital status: Married    Spouse name: N/A  . Number of children: N/A  . Years of education: N/A   Occupational History  . 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 Smoking status: Former Smoker    Types: Cigarettes    Quit date: 03/02/2011  . Smokeless tobacco: Never Used  . Alcohol use No  . Drug use: No  . Sexual activity: Not on file   Other Topics Concern  . Not on file   Social History Narrative   Works in a Publishing rights manager     Past Surgical History:  Procedure  Laterality Date  . ANKLE FRACTURE SURGERY     age 36-lt  . arthroscopy arm  2003   right elbow bone frag  . COLONOSCOPY    . ROTATOR CUFF REPAIR Right 2015?    Family History  Problem Relation Age of Onset  . Colon cancer Neg Hx   . Stomach cancer Neg Hx   . COPD Mother   . Heart disease Mother     Allergies  Allergen Reactions  . Codeine Phosphate Nausea And Vomiting    Current Outpatient Prescriptions on File Prior to Visit  Medication Sig Dispense Refill  . aspirin 81 MG tablet Take 81 mg by mouth daily.    . diazepam (VALIUM) 5 MG tablet TAKE 1 TABLET BY MOUTH THREE TIMES DAILY AS NEEDED 90 tablet 0  . lisinopril (PRINIVIL,ZESTRIL) 10 MG tablet Take 1 tablet (10 mg total) by mouth daily. 30 tablet 1  . meloxicam (MOBIC) 15 MG tablet TAKE 1 TABLET BY MOUTH EVERY DAY 30 tablet 0  . simvastatin (ZOCOR) 20 MG tablet TAKE 1 TABLET(20 MG) BY MOUTH AT BEDTIME 90 tablet 3   Current Facility-Administered Medications on File Prior to Visit  Medication Dose Route Frequency Provider Last Rate Last Dose  . betamethasone acetate-betamethasone sodium phosphate (CELESTONE) injection 3 mg  3 mg Intramuscular Once Daylene Katayama  M, DPM      . betamethasone acetate-betamethasone sodium phosphate (CELESTONE) injection 3 mg  3 mg Intramuscular Once Daylene Katayama M, DPM      . betamethasone acetate-betamethasone sodium phosphate (CELESTONE) injection 3 mg  3 mg Intramuscular Once Evans, Brent M, DPM        BP 136/74 (BP Location: Left Arm)   Temp 98.2 F (36.8 C) (Oral)   Wt 146 lb (66.2 kg)   BMI 22.20 kg/m       Objective:   Physical Exam  Constitutional: He is oriented to person, place, and time. He appears well-developed and well-nourished. No distress.  Cardiovascular: Normal rate, regular rhythm, normal heart sounds and intact distal pulses.  Exam reveals no gallop.   No murmur heard. Pulmonary/Chest: Effort normal and breath sounds normal. No respiratory distress. He has no  wheezes. He has no rales. He exhibits no tenderness.  Neurological: He is alert and oriented to person, place, and time.  Skin: Skin is warm and dry. No rash noted. He is not diaphoretic. No erythema. No pallor.  Psychiatric: He has a normal mood and affect. His behavior is normal. Judgment and thought content normal.  Nursing note and vitals reviewed.     Assessment & Plan:   1. Essential hypertension - Much better controlled. I think if we added any more medication it would drop his blood pressure low. Will keep him on current dose. Encouraged to check blood pressure periodically at home. Return precautions given  - lisinopril (PRINIVIL,ZESTRIL) 10 MG tablet; Take 1 tablet (10 mg total) by mouth daily.  Dispense: 90 tablet; Refill: 3  2. Need for influenza vaccination  - Flu vaccine HIGH DOSE PF (Fluzone High dose)   Dorothyann Peng, NP

## 2017-05-09 ENCOUNTER — Other Ambulatory Visit: Payer: Self-pay | Admitting: Adult Health

## 2017-05-10 NOTE — Telephone Encounter (Signed)
DENIED.  FILLED ON 04/08/17 FOR 1 YEAR.  MESSAGE SENT TO THE PHARMACY TO CHECK FILE.

## 2017-07-07 DIAGNOSIS — H40013 Open angle with borderline findings, low risk, bilateral: Secondary | ICD-10-CM | POA: Diagnosis not present

## 2017-08-10 ENCOUNTER — Other Ambulatory Visit: Payer: Self-pay | Admitting: Adult Health

## 2017-08-10 DIAGNOSIS — F418 Other specified anxiety disorders: Secondary | ICD-10-CM

## 2017-08-23 ENCOUNTER — Ambulatory Visit (INDEPENDENT_AMBULATORY_CARE_PROVIDER_SITE_OTHER): Payer: Medicare Other | Admitting: Adult Health

## 2017-08-23 ENCOUNTER — Encounter: Payer: Self-pay | Admitting: Adult Health

## 2017-08-23 VITALS — BP 126/72 | Temp 98.3°F | Wt 139.0 lb

## 2017-08-23 DIAGNOSIS — B49 Unspecified mycosis: Secondary | ICD-10-CM | POA: Diagnosis not present

## 2017-08-23 MED ORDER — NYSTATIN 100000 UNIT/GM EX CREA: 1.0000 "application " | TOPICAL_CREAM | Freq: Two times a day (BID) | CUTANEOUS | 0 refills | Status: DC

## 2017-08-23 NOTE — Progress Notes (Signed)
Subjective:    Patient ID: John Holland, male    DOB: Aug 27, 1950, 67 y.o.   MRN: [id number]  HPI 67 year old male who  has a past medical history of Anxiety, Arthritis, Bipolar 1 disorder ([place]), Depression, Hepatitis C (12/05/2015), and Hyperlipidemia.  He presents to the office today for the complaint of an itchy red rash behind his ears. This has been present for about 2 months. Denies any drainage or pain. He has not had any fevers or episodes of feeling acutely ill.   Review of Systems   See HPI  Past Medical History:  Diagnosis Date  . Anxiety   . Arthritis   . Bipolar 1 disorder ([place])   . Depression   . Hepatitis C 12/05/2015  . Hyperlipidemia     Social History   Socioeconomic History  . Marital status: Married    Spouse name: Not on file  . Number of children: Not on file  . Years of education: Not on file  . Highest education level: Not on file  Social Needs  . Financial resource strain: Not on file  . Food insecurity - worry: Not on file  . Food insecurity - inability: Not on file  . Transportation needs - medical: Not on file  . Transportation needs - non-medical: Not on file  Occupational History  . Occupation: Psychologist, occupational: [employer]  Tobacco Use  . Smoking status: Former Smoker    Types: Cigarettes    Last attempt to quit: 03/02/2011    Years since quitting: 6.4  . Smokeless tobacco: Never Used  Substance and Sexual Activity  . Alcohol use: No  . Drug use: No  . Sexual activity: Not on file  Other Topics Concern  . Not on file  Social History Narrative   Works in a Publishing rights manager     Past Surgical History:  Procedure Laterality Date  . ANKLE FRACTURE SURGERY     age 60-lt  . arthroscopy arm  2003   right elbow bone frag  . COLONOSCOPY    . ROTATOR CUFF REPAIR Right 2015?    Family History  Problem Relation Age of Onset  . Colon cancer Neg Hx   . Stomach cancer Neg Hx   . COPD Mother   . Heart  disease Mother     Allergies  Allergen Reactions  . Codeine Phosphate Nausea And Vomiting    Current Outpatient Medications on File Prior to Visit  Medication Sig Dispense Refill  . aspirin 81 MG tablet Take 81 mg by mouth daily.    . diazepam (VALIUM) 5 MG tablet TAKE 1 TABLET BY MOUTH THREE TIMES DAILY AS NEEDED 90 tablet 0  . ketoconazole (NIZORAL) 2 % cream   2  . lisinopril (PRINIVIL,ZESTRIL) 10 MG tablet Take 1 tablet (10 mg total) by mouth daily. 90 tablet 3  . meloxicam (MOBIC) 15 MG tablet TAKE 1 TABLET BY MOUTH EVERY DAY 30 tablet 0  . simvastatin (ZOCOR) 20 MG tablet TAKE 1 TABLET(20 MG) BY MOUTH AT BEDTIME 90 tablet 3   Current Facility-Administered Medications on File Prior to Visit  Medication Dose Route Frequency Provider Last Rate Last Dose  . betamethasone acetate-betamethasone sodium phosphate (CELESTONE) injection 3 mg  3 mg Intramuscular Once Daylene Katayama M, DPM      . betamethasone acetate-betamethasone sodium phosphate (CELESTONE) injection 3 mg  3 mg Intramuscular Once Edrick Kins, DPM      .  betamethasone acetate-betamethasone sodium phosphate (CELESTONE) injection 3 mg  3 mg Intramuscular Once Evans, Brent M, DPM        BP 126/72 (BP Location: Left Arm, Cuff Size: Normal)   Temp 98.3 F (36.8 C) (Oral)   Wt 139 lb (63 kg)   BMI 21.13 kg/m       Objective:   Physical Exam  Constitutional: He is oriented to person, place, and time. He appears well-developed and well-nourished. No distress.  HENT:  Right Ear: Hearing, tympanic membrane, external ear and ear canal normal.  Left Ear: Hearing, tympanic membrane and ear canal normal.  Nose: Nose normal.  Mouth/Throat: Uvula is midline and oropharynx is clear and moist.  Neurological: He is alert and oriented to person, place, and time.  Skin: Skin is warm and dry. Rash (red rash behind bilateral ears. Appears fungal ) noted. He is not diaphoretic.  Psychiatric: He has a normal mood and affect. His  behavior is normal. Judgment and thought content normal.  Nursing note and vitals reviewed.     Assessment & Plan:   1. Fungal infection - Will send in Nystatin cream  - Mix with small amount of OTC hydrocortisone cream and apply BID - Follow up if needed  Dorothyann Peng, NP

## 2017-09-07 ENCOUNTER — Ambulatory Visit (INDEPENDENT_AMBULATORY_CARE_PROVIDER_SITE_OTHER): Payer: Medicare Other | Admitting: Podiatry

## 2017-09-07 ENCOUNTER — Ambulatory Visit (INDEPENDENT_AMBULATORY_CARE_PROVIDER_SITE_OTHER): Payer: Medicare Other

## 2017-09-07 DIAGNOSIS — Q828 Other specified congenital malformations of skin: Secondary | ICD-10-CM | POA: Diagnosis not present

## 2017-09-07 DIAGNOSIS — M79671 Pain in right foot: Secondary | ICD-10-CM | POA: Diagnosis not present

## 2017-09-07 DIAGNOSIS — M722 Plantar fascial fibromatosis: Secondary | ICD-10-CM

## 2017-09-09 NOTE — Progress Notes (Signed)
   Subjective: Patient presents to the office today for chief complaint of a painful callus lesion of the right foot that has been present for several weeks. He states that the pain is affecting his ability to ambulate without pain. He also believes he is having a flare up of his plantar fasciitis of the right foot. Ambulation and weightbearing increase the pain. He has not done anything to treat the symptoms. Patient presents today for further treatment and evaluation.  Past Medical History:  Diagnosis Date  . Anxiety   . Arthritis   . Bipolar 1 disorder ([place])   . Depression   . Hepatitis C 12/05/2015  . Hyperlipidemia      Objective:  Physical Exam General: Alert and oriented x3 in no acute distress  Dermatology: Hyperkeratotic lesion present on the right foot. Pain on palpation with a central nucleated core noted. Skin is warm, dry and supple bilateral lower extremities. Negative for open lesions or macerations.  Vascular: Palpable pedal pulses bilaterally. No edema or erythema noted. Capillary refill within normal limits.  Neurological: Epicritic and protective threshold grossly intact bilaterally.   Musculoskeletal Exam: Pain on palpation at the keratotic lesion noted. Range of motion within normal limits bilateral. Muscle strength 5/5 in all groups bilateral.  Radiographic Exam:  Normal osseous mineralization. Joint spaces preserved. No fracture/dislocation/boney destruction.    Assessment: 1. Porokeratosis right foot   Plan of Care:  1. Patient evaluated. X-Rays reviewed.  2. Excisional debridement of keratoic lesion using a chisel blade was performed without incident.  3. Treated area with Salinocaine and dressed with light dressing. 4. Continue wearing custom molded orthotics.  5. Return to clinic as needed.    Edrick Kins, DPM [facility]  Dr. Edrick Kins, [address]                                        [place], [place]  [address]                Office [phone]  Fax [phone]

## 2017-09-13 ENCOUNTER — Telehealth: Payer: Self-pay | Admitting: Family Medicine

## 2017-09-13 ENCOUNTER — Ambulatory Visit (INDEPENDENT_AMBULATORY_CARE_PROVIDER_SITE_OTHER): Payer: Medicare Other | Admitting: Adult Health

## 2017-09-13 ENCOUNTER — Encounter: Payer: Self-pay | Admitting: Adult Health

## 2017-09-13 VITALS — BP 160/84 | Temp 98.1°F | Wt 142.0 lb

## 2017-09-13 DIAGNOSIS — H65112 Acute and subacute allergic otitis media (mucoid) (sanguinous) (serous), left ear: Secondary | ICD-10-CM | POA: Diagnosis not present

## 2017-09-13 MED ORDER — AMOXICILLIN-POT CLAVULANATE 875-125 MG PO TABS: 1.0000 | ORAL_TABLET | Freq: Two times a day (BID) | ORAL | 0 refills | Status: DC

## 2017-09-13 NOTE — Progress Notes (Signed)
Subjective:    Patient ID: John Holland, male    DOB: January 01, 1951, 67 y.o.   MRN: [id number]  HPI   67 year old male who  has a past medical history of Anxiety, Arthritis, Bipolar 1 disorder ([place]), Depression, Hepatitis C (12/05/2015), and Hyperlipidemia. He presents to the office today for an acute complaint of left-sided ear pain and ringing.  Does report drainage from the ear that he notices on his pillowcase after he wakes up in the morning.Symptoms have been present for approximately 4-5 days.  Denies any fevers  Review of Systems See HPI   Past Medical History:  Diagnosis Date  . Anxiety   . Arthritis   . Bipolar 1 disorder ([place])   . Depression   . Hepatitis C 12/05/2015  . Hyperlipidemia     Social History   Socioeconomic History  . Marital status: Married    Spouse name: Not on file  . Number of children: Not on file  . Years of education: Not on file  . Highest education level: Not on file  Occupational History  . Occupation: Psychologist, occupational: [employer]  Social Needs  . Financial resource strain: Not on file  . Food insecurity:    Worry: Not on file    Inability: Not on file  . Transportation needs:    Medical: Not on file    Non-medical: Not on file  Tobacco Use  . Smoking status: Former Smoker    Types: Cigarettes    Last attempt to quit: 03/02/2011    Years since quitting: 6.5  . Smokeless tobacco: Never Used  Substance and Sexual Activity  . Alcohol use: No  . Drug use: No  . Sexual activity: Not on file  Lifestyle  . Physical activity:    Days per week: Not on file    Minutes per session: Not on file  . Stress: Not on file  Relationships  . Social connections:    Talks on phone: Not on file    Gets together: Not on file    Attends religious service: Not on file    Active member of club or organization: Not on file    Attends meetings of clubs or organizations: Not on file    Relationship status: Not on file  .  Intimate partner violence:    Fear of current or ex partner: Not on file    Emotionally abused: Not on file    Physically abused: Not on file    Forced sexual activity: Not on file  Other Topics Concern  . Not on file  Social History Narrative   Works in a Publishing rights manager     Past Surgical History:  Procedure Laterality Date  . ANKLE FRACTURE SURGERY     age 65-lt  . arthroscopy arm  2003   right elbow bone frag  . COLONOSCOPY    . ROTATOR CUFF REPAIR Right 2015?    Family History  Problem Relation Age of Onset  . Colon cancer Neg Hx   . Stomach cancer Neg Hx   . COPD Mother   . Heart disease Mother     Allergies  Allergen Reactions  . Codeine Phosphate Nausea And Vomiting    Current Outpatient Medications on File Prior to Visit  Medication Sig Dispense Refill  . aspirin 81 MG tablet Take 81 mg by mouth daily.    . diazepam (VALIUM) 5 MG tablet TAKE 1 TABLET BY  MOUTH THREE TIMES DAILY AS NEEDED 90 tablet 0  . ketoconazole (NIZORAL) 2 % cream   2  . lisinopril (PRINIVIL,ZESTRIL) 10 MG tablet Take 1 tablet (10 mg total) by mouth daily. 90 tablet 3  . nystatin cream (MYCOSTATIN) Apply 1 application topically 2 (two) times daily. 30 g 0  . simvastatin (ZOCOR) 20 MG tablet TAKE 1 TABLET(20 MG) BY MOUTH AT BEDTIME 90 tablet 3  . meloxicam (MOBIC) 15 MG tablet TAKE 1 TABLET BY MOUTH EVERY DAY (Patient not taking: Reported on 09/13/2017) 30 tablet 0   Current Facility-Administered Medications on File Prior to Visit  Medication Dose Route Frequency Provider Last Rate Last Dose  . betamethasone acetate-betamethasone sodium phosphate (CELESTONE) injection 3 mg  3 mg Intramuscular Once Daylene Katayama M, DPM      . betamethasone acetate-betamethasone sodium phosphate (CELESTONE) injection 3 mg  3 mg Intramuscular Once Daylene Katayama M, DPM      . betamethasone acetate-betamethasone sodium phosphate (CELESTONE) injection 3 mg  3 mg Intramuscular Once Evans, Brent M, DPM         BP (!) 160/84   Temp 98.1 F (36.7 C) (Oral)   Wt 142 lb (64.4 kg)   BMI 21.59 kg/m        Objective:   Physical Exam  Constitutional: He appears well-developed and well-nourished. No distress.  HENT:  Right Ear: Hearing, tympanic membrane, external ear and ear canal normal.  Left Ear: Tympanic membrane is bulging. A middle ear effusion is present.  Nose: Nose normal. Right sinus exhibits no maxillary sinus tenderness and no frontal sinus tenderness. Left sinus exhibits no maxillary sinus tenderness and no frontal sinus tenderness.  Mouth/Throat: Uvula is midline, oropharynx is clear and moist and mucous membranes are normal.  Mucoid drainage noted behind TM.  No ruptured TM noted  Cardiovascular: Normal rate, regular rhythm, normal heart sounds and intact distal pulses. Exam reveals no gallop and no friction rub.  No murmur heard. Pulmonary/Chest: Effort normal and breath sounds normal. No respiratory distress. He has no wheezes. He has no rales. He exhibits no tenderness.  Skin: He is not diaphoretic.  Nursing note and vitals reviewed.     Assessment & Plan:  1. Acute mucoid otitis media of left ear - amoxicillin-clavulanate (AUGMENTIN) 875-125 MG tablet; Take 1 tablet by mouth 2 (two) times daily.  Dispense: 20 tablet; Refill: 0 -Follow-up if symptoms have not resolved by the end of the antibiotic regimen.  Follow-up sooner if symptoms worsen or fever develops -Consider referral to ENT  Dorothyann Peng, NP

## 2017-09-13 NOTE — Telephone Encounter (Signed)
We can see him in the office for that so he doesn't need to wait for ENT to see him

## 2017-09-13 NOTE — Telephone Encounter (Signed)
Copied from [place] [id number]. Topic: Referral - Request >> Sep 13, 2017 10:01 AM John Holland wrote: Reason for CRM: Requesting referral to ENT, left ear infection. Please advise    I spoke with John Holland and he is on schedule to see Tommi Rumps this afternoon.

## 2017-09-14 ENCOUNTER — Other Ambulatory Visit: Payer: Self-pay | Admitting: Adult Health

## 2017-09-14 DIAGNOSIS — F418 Other specified anxiety disorders: Secondary | ICD-10-CM

## 2017-09-28 ENCOUNTER — Other Ambulatory Visit: Payer: Self-pay | Admitting: Adult Health

## 2017-09-28 ENCOUNTER — Telehealth: Payer: Self-pay | Admitting: Adult Health

## 2017-09-28 DIAGNOSIS — H65112 Acute and subacute allergic otitis media (mucoid) (sanguinous) (serous), left ear: Secondary | ICD-10-CM

## 2017-09-28 MED ORDER — AMOXICILLIN-POT CLAVULANATE 875-125 MG PO TABS: 1.0000 | ORAL_TABLET | Freq: Two times a day (BID) | ORAL | 0 refills | Status: AC

## 2017-09-28 NOTE — Telephone Encounter (Signed)
Pt notified to pick up rx.  Instructed to come back for appt if not well after completing the antibiotic.

## 2017-09-28 NOTE — Telephone Encounter (Signed)
Sent to [facility].

## 2017-09-28 NOTE — Telephone Encounter (Signed)
Copied from [place] [id number]. Topic: Quick Communication - Rx Refill/Question >> Sep 28, 2017  2:27 PM Margot Ables wrote: Medication: pt called stating his ear is getting better. He can feel it squishing around in there. He said there is fluid leaking out of ear. Pt requesting another round of amoxicillin-clavulanate (AUGMENTIN) 875-125 MG tablet  Preferred Pharmacy (with phone number or street name): [facility] [place], [place] - [address] [phone] (Phone) [phone] (Fax)

## 2017-10-11 ENCOUNTER — Ambulatory Visit (INDEPENDENT_AMBULATORY_CARE_PROVIDER_SITE_OTHER): Payer: Medicare Other | Admitting: Adult Health

## 2017-10-11 ENCOUNTER — Encounter: Payer: Self-pay | Admitting: Adult Health

## 2017-10-11 VITALS — BP 124/70 | Temp 98.3°F | Wt 142.0 lb

## 2017-10-11 DIAGNOSIS — S00412A Abrasion of left ear, initial encounter: Secondary | ICD-10-CM | POA: Diagnosis not present

## 2017-10-11 NOTE — Progress Notes (Signed)
Subjective:    Patient ID: John Holland, male    DOB: March 02, 1951, 67 y.o.   MRN: [id number]  HPI  67 year old male who  has a past medical history of Anxiety, Arthritis, Bipolar 1 disorder ([place]), Depression, Hepatitis C (12/05/2015), and Hyperlipidemia.  He presents to the office today for the acute complaint of " itching in my left ear". Has been present for a week or two. Recently treated with two rounds of Augmentin for otitis media.   Has been using a q tip daily.   Denies any drainage   Review of Systems See hPI   Past Medical History:  Diagnosis Date  . Anxiety   . Arthritis   . Bipolar 1 disorder ([place])   . Depression   . Hepatitis C 12/05/2015  . Hyperlipidemia     Social History   Socioeconomic History  . Marital status: Married    Spouse name: Not on file  . Number of children: Not on file  . Years of education: Not on file  . Highest education level: Not on file  Occupational History  . Occupation: Psychologist, occupational: [employer]  Social Needs  . Financial resource strain: Not on file  . Food insecurity:    Worry: Not on file    Inability: Not on file  . Transportation needs:    Medical: Not on file    Non-medical: Not on file  Tobacco Use  . Smoking status: Former Smoker    Types: Cigarettes    Last attempt to quit: 03/02/2011    Years since quitting: 6.6  . Smokeless tobacco: Never Used  Substance and Sexual Activity  . Alcohol use: No  . Drug use: No  . Sexual activity: Not on file  Lifestyle  . Physical activity:    Days per week: Not on file    Minutes per session: Not on file  . Stress: Not on file  Relationships  . Social connections:    Talks on phone: Not on file    Gets together: Not on file    Attends religious service: Not on file    Active member of club or organization: Not on file    Attends meetings of clubs or organizations: Not on file    Relationship status: Not on file  . Intimate partner violence:     Fear of current or ex partner: Not on file    Emotionally abused: Not on file    Physically abused: Not on file    Forced sexual activity: Not on file  Other Topics Concern  . Not on file  Social History Narrative   Works in a Publishing rights manager     Past Surgical History:  Procedure Laterality Date  . ANKLE FRACTURE SURGERY     age 45-lt  . arthroscopy arm  2003   right elbow bone frag  . COLONOSCOPY    . ROTATOR CUFF REPAIR Right 2015?    Family History  Problem Relation Age of Onset  . Colon cancer Neg Hx   . Stomach cancer Neg Hx   . COPD Mother   . Heart disease Mother     Allergies  Allergen Reactions  . Codeine Phosphate Nausea And Vomiting    Current Outpatient Medications on File Prior to Visit  Medication Sig Dispense Refill  . aspirin 81 MG tablet Take 81 mg by mouth daily.    . diazepam (VALIUM) 5 MG tablet TAKE  1 TABLET BY MOUTH THREE TIMES DAILY AS NEEDED 90 tablet 2  . ketoconazole (NIZORAL) 2 % cream   2  . lisinopril (PRINIVIL,ZESTRIL) 10 MG tablet Take 1 tablet (10 mg total) by mouth daily. 90 tablet 3  . meloxicam (MOBIC) 15 MG tablet TAKE 1 TABLET BY MOUTH EVERY DAY 30 tablet 0  . nystatin cream (MYCOSTATIN) Apply 1 application topically 2 (two) times daily. 30 g 0  . simvastatin (ZOCOR) 20 MG tablet TAKE 1 TABLET(20 MG) BY MOUTH AT BEDTIME 90 tablet 3   Current Facility-Administered Medications on File Prior to Visit  Medication Dose Route Frequency Provider Last Rate Last Dose  . betamethasone acetate-betamethasone sodium phosphate (CELESTONE) injection 3 mg  3 mg Intramuscular Once Daylene Katayama M, DPM      . betamethasone acetate-betamethasone sodium phosphate (CELESTONE) injection 3 mg  3 mg Intramuscular Once Daylene Katayama M, DPM      . betamethasone acetate-betamethasone sodium phosphate (CELESTONE) injection 3 mg  3 mg Intramuscular Once Evans, Brent M, DPM        BP 124/70   Temp 98.3 F (36.8 C) (Oral)   Wt 142 lb (64.4 kg)    BMI 21.59 kg/m       Objective:   Physical Exam  Constitutional: He appears well-developed and well-nourished. No distress.  HENT:  Right Ear: Hearing, tympanic membrane, external ear and ear canal normal.  Left Ear: Hearing and tympanic membrane normal.  Dry skin noted in left ear canal. Pin point area just inside canal that appears irritated. No signs of infection   Skin: He is not diaphoretic.  Nursing note and vitals reviewed.     Assessment & Plan:  1. Abrasion of left ear canal, initial encounter - Has dry ear canal. Advised a couple of drops of mineral oil to ear daily.  - No signs of infection  - Follow up as needed  Dorothyann Peng, NP

## 2017-11-10 DIAGNOSIS — H60333 Swimmer's ear, bilateral: Secondary | ICD-10-CM | POA: Diagnosis not present

## 2017-11-16 DIAGNOSIS — H60333 Swimmer's ear, bilateral: Secondary | ICD-10-CM | POA: Diagnosis not present

## 2017-12-29 ENCOUNTER — Other Ambulatory Visit: Payer: Self-pay | Admitting: Adult Health

## 2017-12-29 ENCOUNTER — Other Ambulatory Visit: Payer: Self-pay | Admitting: Family Medicine

## 2017-12-29 DIAGNOSIS — E785 Hyperlipidemia, unspecified: Secondary | ICD-10-CM

## 2017-12-29 MED ORDER — SIMVASTATIN 20 MG PO TABS: ORAL_TABLET | ORAL | 0 refills | Status: DC

## 2017-12-29 NOTE — Telephone Encounter (Signed)
30 DAY SUPPLY SENT TO THE PHARMACY.  PT NOW DUE FOR CPX. 

## 2017-12-29 NOTE — Telephone Encounter (Signed)
Request is for 90 day supply.  Request denied.  Pt due for cpx.

## 2018-01-06 ENCOUNTER — Other Ambulatory Visit: Payer: Self-pay

## 2018-01-16 ENCOUNTER — Other Ambulatory Visit: Payer: Self-pay | Admitting: Adult Health

## 2018-01-16 DIAGNOSIS — F418 Other specified anxiety disorders: Secondary | ICD-10-CM

## 2018-01-17 NOTE — Telephone Encounter (Signed)
Pt is now past due for CPX and lab work.  Please advise.

## 2018-01-18 ENCOUNTER — Other Ambulatory Visit: Payer: Self-pay | Admitting: Adult Health

## 2018-01-18 DIAGNOSIS — F418 Other specified anxiety disorders: Secondary | ICD-10-CM

## 2018-01-18 MED ORDER — DIAZEPAM 5 MG PO TABS: 5.0000 mg | ORAL_TABLET | Freq: Three times a day (TID) | ORAL | 0 refills | Status: DC | PRN

## 2018-01-18 NOTE — Telephone Encounter (Signed)
I sent in 30 days but he needs CPE for further refills.

## 2018-01-18 NOTE — Telephone Encounter (Signed)
Called to the pharmacy and left on machine. 

## 2018-01-27 DIAGNOSIS — Z23 Encounter for immunization: Secondary | ICD-10-CM | POA: Diagnosis not present

## 2018-02-27 ENCOUNTER — Other Ambulatory Visit: Payer: Self-pay | Admitting: Adult Health

## 2018-02-27 DIAGNOSIS — F418 Other specified anxiety disorders: Secondary | ICD-10-CM

## 2018-02-28 NOTE — Telephone Encounter (Signed)
Last office visit 10/11/17 Last refill 01/18/18 Last CPE 12/07/16  Okay to fill?

## 2018-04-03 ENCOUNTER — Other Ambulatory Visit: Payer: Self-pay | Admitting: Adult Health

## 2018-04-03 DIAGNOSIS — F418 Other specified anxiety disorders: Secondary | ICD-10-CM

## 2018-04-25 ENCOUNTER — Other Ambulatory Visit: Payer: Self-pay

## 2018-05-29 ENCOUNTER — Other Ambulatory Visit: Payer: Self-pay | Admitting: Adult Health

## 2018-05-29 DIAGNOSIS — E785 Hyperlipidemia, unspecified: Secondary | ICD-10-CM

## 2018-05-30 NOTE — Telephone Encounter (Signed)
Denied.  Pt needs appt 

## 2018-06-20 ENCOUNTER — Telehealth: Payer: Self-pay | Admitting: Adult Health

## 2018-06-20 DIAGNOSIS — E785 Hyperlipidemia, unspecified: Secondary | ICD-10-CM

## 2018-06-21 NOTE — Telephone Encounter (Signed)
Denied.  Pt needs CPX and fasting lab work.

## 2018-06-22 ENCOUNTER — Encounter: Payer: Self-pay | Admitting: Adult Health

## 2018-06-22 ENCOUNTER — Other Ambulatory Visit: Payer: Self-pay | Admitting: Adult Health

## 2018-06-22 DIAGNOSIS — E785 Hyperlipidemia, unspecified: Secondary | ICD-10-CM

## 2018-06-22 MED ORDER — SIMVASTATIN 20 MG PO TABS: ORAL_TABLET | ORAL | 0 refills | Status: DC

## 2018-06-22 NOTE — Addendum Note (Signed)
Addended by: Miles Costain T on: 06/22/2018 09:42 AM   Modules accepted: Orders

## 2018-06-22 NOTE — Telephone Encounter (Signed)
Sent to the pharmacy by e-scribe. 

## 2018-06-22 NOTE — Telephone Encounter (Signed)
Pt called and schedule appt for 07/06/2018 with [facility]. Please send in simvastatin until appt.   [id number] [address], [place] [phone] (Phone) [phone] (Fax)

## 2018-06-22 NOTE — Telephone Encounter (Signed)
THIS REQUEST HAS ALREADY BEEN TAKEN CARE OF.

## 2018-07-06 ENCOUNTER — Ambulatory Visit (INDEPENDENT_AMBULATORY_CARE_PROVIDER_SITE_OTHER): Payer: Medicare Other | Admitting: Adult Health

## 2018-07-06 ENCOUNTER — Encounter: Payer: Self-pay | Admitting: Adult Health

## 2018-07-06 VITALS — BP 140/64 | Temp 98.0°F | Ht 69.0 in | Wt 139.0 lb

## 2018-07-06 DIAGNOSIS — N400 Enlarged prostate without lower urinary tract symptoms: Secondary | ICD-10-CM | POA: Diagnosis not present

## 2018-07-06 DIAGNOSIS — I10 Essential (primary) hypertension: Secondary | ICD-10-CM | POA: Diagnosis not present

## 2018-07-06 DIAGNOSIS — E782 Mixed hyperlipidemia: Secondary | ICD-10-CM

## 2018-07-06 DIAGNOSIS — E785 Hyperlipidemia, unspecified: Secondary | ICD-10-CM

## 2018-07-06 DIAGNOSIS — F418 Other specified anxiety disorders: Secondary | ICD-10-CM | POA: Diagnosis not present

## 2018-07-06 LAB — COMPREHENSIVE METABOLIC PANEL
ALBUMIN: 4.7 g/dL (ref 3.5–5.2)
ALK PHOS: 51 U/L (ref 39–117)
ALT: 15 U/L (ref 0–53)
AST: 17 U/L (ref 0–37)
BILIRUBIN TOTAL: 0.5 mg/dL (ref 0.2–1.2)
BUN: 14 mg/dL (ref 6–23)
CALCIUM: 9.5 mg/dL (ref 8.4–10.5)
CO2: 27 mEq/L (ref 19–32)
CREATININE: 1.15 mg/dL (ref 0.40–1.50)
Chloride: 106 mEq/L (ref 96–112)
GFR: 63.19 mL/min (ref >60.00)
Glucose, Bld: 84 mg/dL (ref 70–99)
Potassium: 4.6 mEq/L (ref 3.5–5.1)
Sodium: 140 mEq/L (ref 135–145)
TOTAL PROTEIN: 6.9 g/dL (ref 6.0–8.3)

## 2018-07-06 LAB — CBC WITH DIFFERENTIAL/PLATELET
BASOS ABS: 0.1 10*3/uL (ref 0.0–0.1)
Basophils Relative: 1.5 % (ref 0.0–3.0)
EOS ABS: 0.1 10*3/uL (ref 0.0–0.7)
Eosinophils Relative: 2.2 % (ref 0.0–5.0)
HEMATOCRIT: 43.4 % (ref 39.0–52.0)
HEMOGLOBIN: 14.8 g/dL (ref 13.0–17.0)
Lymphocytes Relative: 29.7 % (ref 12.0–46.0)
Lymphs Abs: 1.7 10*3/uL (ref 0.7–4.0)
MCHC: 34.1 g/dL (ref 30.0–36.0)
MCV: 92.6 fl (ref 78.0–100.0)
Monocytes Absolute: 0.6 10*3/uL (ref 0.1–1.0)
Monocytes Relative: 9.9 % (ref 3.0–12.0)
NEUTROS ABS: 3.3 10*3/uL (ref 1.4–7.7)
Neutrophils Relative %: 56.7 % (ref 43.0–77.0)
PLATELETS: 225 10*3/uL (ref 150.0–400.0)
RBC: 4.69 Mil/uL (ref 4.22–5.81)
RDW: 13 % (ref 11.5–15.5)
WBC: 5.7 10*3/uL (ref 4.0–10.5)

## 2018-07-06 LAB — LIPID PANEL
CHOL/HDL RATIO: 4
Cholesterol: 179 mg/dL (ref 0–200)
HDL: 47.4 mg/dL (ref >39.00)
LDL Cholesterol: 113 mg/dL — ABNORMAL HIGH (ref 0–99)
NonHDL: 131.9
Triglycerides: 95 mg/dL (ref 0.0–149.0)
VLDL: 19 mg/dL (ref 0.0–40.0)

## 2018-07-06 LAB — PSA: PSA: 2.02 ng/mL (ref 0.10–4.00)

## 2018-07-06 LAB — TSH: TSH: 0.78 u[IU]/mL (ref 0.35–4.50)

## 2018-07-06 MED ORDER — SIMVASTATIN 20 MG PO TABS: ORAL_TABLET | ORAL | 3 refills | Status: DC

## 2018-07-06 MED ORDER — DIAZEPAM 5 MG PO TABS: ORAL_TABLET | ORAL | 2 refills | Status: DC

## 2018-07-06 MED ORDER — SIMVASTATIN 20 MG PO TABS: ORAL_TABLET | ORAL | 0 refills | Status: DC

## 2018-07-06 MED ORDER — LISINOPRIL 10 MG PO TABS: 10.0000 mg | ORAL_TABLET | Freq: Every day | ORAL | 3 refills | Status: DC

## 2018-07-06 NOTE — Progress Notes (Signed)
Subjective:    Patient ID: John Holland, male    DOB: [birth year], 68 y.o.   MRN: [id number]  HPI Patient presents for yearly preventative medicine examination. He is a pleasant 68 year old male who  has a past medical history of Anxiety, Arthritis, Bipolar 1 disorder ([place]), Depression, Hepatitis C (12/05/2015), and Hyperlipidemia.   He reports that his wife died about a month ago from a brain aneurysm. He is doing "ok" and has a good support system   Hyperlipidemia-takes simvastatin 20 mg tablets.  Denies myalgias Lab Results  Component Value Date   CHOL 157 12/07/2016   HDL 47.80 12/07/2016   LDLCALC 89 12/07/2016   TRIG 102.0 12/07/2016   CHOLHDL 3 12/07/2016   Essential Hypertension -takes lisinopril 10 mg daily BP Readings from Last 3 Encounters:  07/06/18 140/64  10/11/17 124/70  09/13/17 (!) 160/84   Anxiety -takes Valium 5 mg 3 times daily as needed  All immunizations and health maintenance protocols were reviewed with the patient and needed orders were placed.  Appropriate screening laboratory values were ordered for the patient including screening of hyperlipidemia, renal function and hepatic function. If indicated by BPH, a PSA was ordered.  Medication reconciliation,  past medical history, social history, problem list and allergies were reviewed in detail with the patient  Goals were established with regard to weight loss, exercise, and  diet in compliance with medications Wt Readings from Last 3 Encounters:  07/06/18 139 lb (63 kg)  10/11/17 142 lb (64.4 kg)  09/13/17 142 lb (64.4 kg)   End of life planning was discussed.  He is up to date on routine colonoscopy.   Review of Systems  Constitutional: Negative.   HENT: Negative.   Eyes: Negative.   Respiratory: Negative.   Cardiovascular: Negative.   Gastrointestinal: Negative.   Endocrine: Negative.   Genitourinary: Negative.   Musculoskeletal: Negative.   Skin: Negative.   Allergic/Immunologic:  Negative.   Neurological: Negative.   Hematological: Negative.   Psychiatric/Behavioral: Positive for dysphoric mood.  All other systems reviewed and are negative.  Past Medical History:  Diagnosis Date  . Anxiety   . Arthritis   . Bipolar 1 disorder ([place])   . Depression   . Hepatitis C 12/05/2015  . Hyperlipidemia     Social History   Socioeconomic History  . Marital status: Married    Spouse name: Not on file  . Number of children: Not on file  . Years of education: Not on file  . Highest education level: Not on file  Occupational History  . Occupation: Psychologist, occupational: [employer]  Social Needs  . Financial resource strain: Not on file  . Food insecurity:    Worry: Not on file    Inability: Not on file  . Transportation needs:    Medical: Not on file    Non-medical: Not on file  Tobacco Use  . Smoking status: Former Smoker    Types: Cigarettes    Last attempt to quit: 03/02/2011    Years since quitting: 7.3  . Smokeless tobacco: Never Used  Substance and Sexual Activity  . Alcohol use: No  . Drug use: No  . Sexual activity: Not on file  Lifestyle  . Physical activity:    Days per week: Not on file    Minutes per session: Not on file  . Stress: Not on file  Relationships  . Social connections:    Talks on phone: Not  on file    Gets together: Not on file    Attends religious service: Not on file    Active member of club or organization: Not on file    Attends meetings of clubs or organizations: Not on file    Relationship status: Not on file  . Intimate partner violence:    Fear of current or ex partner: Not on file    Emotionally abused: Not on file    Physically abused: Not on file    Forced sexual activity: Not on file  Other Topics Concern  . Not on file  Social History Narrative   Works in a Publishing rights manager     Past Surgical History:  Procedure Laterality Date  . ANKLE FRACTURE SURGERY     age 19-lt  .  arthroscopy arm  2003   right elbow bone frag  . COLONOSCOPY    . ROTATOR CUFF REPAIR Right 2015?    Family History  Problem Relation Age of Onset  . COPD Mother   . Heart disease Mother   . Colon cancer Neg Hx   . Stomach cancer Neg Hx     Allergies  Allergen Reactions  . Codeine Phosphate Nausea And Vomiting    Current Outpatient Medications on File Prior to Visit  Medication Sig Dispense Refill  . aspirin 81 MG tablet Take 81 mg by mouth daily.    . diazepam (VALIUM) 5 MG tablet TAKE 1 TABLET(5 MG) BY MOUTH THREE TIMES DAILY AS NEEDED FOR ANXIETY 90 tablet 2  . ketoconazole (NIZORAL) 2 % cream   2  . lisinopril (PRINIVIL,ZESTRIL) 10 MG tablet Take 1 tablet (10 mg total) by mouth daily. 90 tablet 3  . nystatin cream (MYCOSTATIN) Apply 1 application topically 2 (two) times daily. 30 g 0  . simvastatin (ZOCOR) 20 MG tablet TAKE 1 TABLET(20 MG) BY MOUTH AT BEDTIME.  **NEEDS YEARLY APPOINTMENT** 30 tablet 0   Current Facility-Administered Medications on File Prior to Visit  Medication Dose Route Frequency Provider Last Rate Last Dose  . betamethasone acetate-betamethasone sodium phosphate (CELESTONE) injection 3 mg  3 mg Intramuscular Once Daylene Katayama M, DPM      . betamethasone acetate-betamethasone sodium phosphate (CELESTONE) injection 3 mg  3 mg Intramuscular Once Daylene Katayama M, DPM      . betamethasone acetate-betamethasone sodium phosphate (CELESTONE) injection 3 mg  3 mg Intramuscular Once Evans, Brent M, DPM        BP 140/64   Temp 98 F (36.7 C)   Ht 5\' 9"  (1.753 m)   Wt 139 lb (63 kg)   BMI 20.53 kg/m       Objective:   Physical Exam Vitals signs and nursing note reviewed.  Constitutional:      General: He is not in acute distress.    Appearance: Normal appearance. He is well-developed. He is obese. He is not ill-appearing, toxic-appearing or diaphoretic.  HENT:     Head: Normocephalic and atraumatic.     Right Ear: Tympanic membrane, ear canal and  external ear normal. There is no impacted cerumen.     Left Ear: Tympanic membrane, ear canal and external ear normal. There is no impacted cerumen.     Nose: Nose normal. No congestion or rhinorrhea.     Mouth/Throat:     Mouth: Mucous membranes are moist.     Pharynx: Oropharynx is clear. No oropharyngeal exudate or posterior oropharyngeal erythema.  Eyes:     General: No scleral  icterus.       Right eye: No discharge.        Left eye: No discharge.     Extraocular Movements: Extraocular movements intact.     Conjunctiva/sclera: Conjunctivae normal.     Pupils: Pupils are equal, round, and reactive to light.  Neck:     Musculoskeletal: Normal range of motion and neck supple. No neck rigidity or muscular tenderness.     Thyroid: No thyromegaly.     Vascular: No carotid bruit.     Trachea: No tracheal deviation.  Cardiovascular:     Rate and Rhythm: Normal rate and regular rhythm.     Heart sounds: Normal heart sounds. No murmur. No friction rub. No gallop.   Pulmonary:     Effort: Pulmonary effort is normal. No respiratory distress.     Breath sounds: Normal breath sounds. No stridor. No wheezing, rhonchi or rales.  Chest:     Chest wall: No tenderness.  Abdominal:     General: Bowel sounds are normal. There is no distension.     Palpations: Abdomen is soft. There is no mass.     Tenderness: There is no abdominal tenderness. There is no right CVA tenderness, left CVA tenderness, guarding or rebound.     Hernia: No hernia is present.  Genitourinary:    Comments: Will do PSA Musculoskeletal: Normal range of motion.        General: No swelling, tenderness, deformity or signs of injury.     Right lower leg: No edema.     Left lower leg: No edema.  Lymphadenopathy:     Cervical: No cervical adenopathy.  Skin:    General: Skin is warm and dry.     Coloration: Skin is not jaundiced or pale.     Findings: No bruising, erythema, lesion or rash.  Neurological:     General: No focal  deficit present.     Mental Status: He is alert and oriented to person, place, and time.     Cranial Nerves: No cranial nerve deficit.     Sensory: No sensory deficit.     Motor: No weakness.     Coordination: Coordination normal.     Gait: Gait normal.     Deep Tendon Reflexes: Reflexes normal.  Psychiatric:        Mood and Affect: Mood normal.        Behavior: Behavior normal.        Thought Content: Thought content normal.        Judgment: Judgment normal.        Assessment & Plan:  1. Mixed hyperlipidemia - continue to stay active and exercise  - CBC with Differential/Platelet - Comprehensive metabolic panel - Lipid panel - TSH  - simvastatin (ZOCOR) 20 MG tablet; TAKE 1 TABLET(20 MG) BY MOUTH AT BEDTIME.  Dispense: 90 tablet; Refill: 3  2. Essential hypertension - Did not take BP meds this morning  - CBC with Differential/Platelet - Comprehensive metabolic panel - Lipid panel - TSH - lisinopril (PRINIVIL,ZESTRIL) 10 MG tablet; Take 1 tablet (10 mg total) by mouth daily.  Dispense: 90 tablet; Refill: 3  3. Depression with anxiety - Offered my condolences  - diazepam (VALIUM) 5 MG tablet; TAKE 1 TABLET(5 MG) BY MOUTH THREE TIMES DAILY AS NEEDED FOR ANXIETY  Dispense: 90 tablet; Refill: 2  4. Benign prostatic hyperplasia without lower urinary tract symptoms  - PSA  5. Hyperlipidemia  Dorothyann Peng

## 2018-07-18 ENCOUNTER — Other Ambulatory Visit: Payer: Self-pay | Admitting: Adult Health

## 2018-07-18 DIAGNOSIS — F418 Other specified anxiety disorders: Secondary | ICD-10-CM

## 2018-07-18 DIAGNOSIS — E785 Hyperlipidemia, unspecified: Secondary | ICD-10-CM

## 2018-07-18 DIAGNOSIS — I10 Essential (primary) hypertension: Secondary | ICD-10-CM

## 2018-07-18 NOTE — Telephone Encounter (Signed)
Denied.  Filled on 07/06/2018.  Receipt confirmed by pharmacy per Epic.

## 2018-07-19 NOTE — Telephone Encounter (Signed)
Denied.  Both medications filled on 07/06/2018.

## 2018-08-15 ENCOUNTER — Other Ambulatory Visit: Payer: Self-pay | Admitting: Adult Health

## 2018-08-15 DIAGNOSIS — E785 Hyperlipidemia, unspecified: Secondary | ICD-10-CM

## 2018-08-16 NOTE — Telephone Encounter (Signed)
FILLED FOR 1 YEAR ON 07/06/2018.  REQUEST DENIED.

## 2018-12-01 ENCOUNTER — Other Ambulatory Visit: Payer: Self-pay | Admitting: Adult Health

## 2018-12-01 DIAGNOSIS — F418 Other specified anxiety disorders: Secondary | ICD-10-CM

## 2019-01-09 ENCOUNTER — Telehealth: Payer: Self-pay

## 2019-01-09 NOTE — Telephone Encounter (Signed)
Copied from [place] [id number]. Topic: General - Other >> Jan 09, 2019 10:59 AM Keene Breath wrote: Reason for CRM: Patient called to ask the doctor to call him regarding increasing his Valium to 5 more mg.  Please advise and call patient at [phone]

## 2019-01-09 NOTE — Telephone Encounter (Signed)
Pt now scheduled for a telephone call with Cory at 11 AM on 01/10/2019.  Nothing further needed.

## 2019-01-10 ENCOUNTER — Ambulatory Visit (INDEPENDENT_AMBULATORY_CARE_PROVIDER_SITE_OTHER): Payer: Medicare Other | Admitting: Adult Health

## 2019-01-10 ENCOUNTER — Encounter: Payer: Self-pay | Admitting: Adult Health

## 2019-01-10 ENCOUNTER — Other Ambulatory Visit: Payer: Self-pay

## 2019-01-10 DIAGNOSIS — F418 Other specified anxiety disorders: Secondary | ICD-10-CM | POA: Diagnosis not present

## 2019-01-10 NOTE — Progress Notes (Signed)
Virtual Visit via Telephone Note  I connected with John Holland on 01/10/19 at 11:00 AM EDT by telephone and verified that I am speaking with the correct person using two identifiers.   I discussed the limitations, risks, security and privacy concerns of performing an evaluation and management service by telephone and the availability of in person appointments. I also discussed with the patient that there may be a patient responsible charge related to this service. The patient expressed understanding and agreed to proceed.  Location patient: home Location provider: work or home office Participants present for the call: patient, provider Patient did not have a visit in the prior 7 days to address this/these issue(s).   History of Present Illness: 68 year old male who is being evaluated today for anxiety.  He would like to increase his Valium dose to 10 mg as needed.  Currently he is prescribed Valium 5 mg 3 times daily as needed.  Reports that his anxiety has increased significantly since Jun 23, 2023 when his wife passed away as well as his ex-wife who lives close to him as "full of cancerous tumors" and he has been having to become the main caregiver for her.  He does endorse depression as well, does have a good support system and has been seeking therapy with his pastor.  In the past he has been seen by psychiatry and is been prescribed multiple antidepressants/antianxiety medications including Seroquel, Lexapro, and Prozac.  Reports that these did not help with his symptoms.  Does not want to go back and see a psychiatrist at this time.  He does report that he has plenty of medication at home, but he wanted to run this by me first before he increased the dose.  Denies suicidal ideation, is eating well and is sleeping well.   Observations/Objective: Patient tearful at times on the phone.  I do not appreciate any SOB. Speech and thought processing are grossly intact. Patient reported  vitals:  Assessment and Plan: 1. Depression with anxiety -I am okay with him increasing Valium to 10 mg as needed.  If he does not feel like he needs to increase he can stay with 5 mg.  Since he has plenty of medications at home I will change the dosing in his chart but not send in a new prescription.  He will follow-up with me when he gets close to finishing his current prescription we we will reevaluate  Follow Up Instructions:  I did not refer this patient for an OV in the next 24 hours for this/these issue(s).  I discussed the assessment and treatment plan with the patient. The patient was provided an opportunity to ask questions and all were answered. The patient agreed with the plan and demonstrated an understanding of the instructions.   The patient was advised to call back or seek an in-person evaluation if the symptoms worsen or if the condition fails to improve as anticipated.  I provided 15 minutes of non-face-to-face time during this encounter.   Dorothyann Peng, NP

## 2019-02-22 ENCOUNTER — Other Ambulatory Visit: Payer: Self-pay

## 2019-02-22 ENCOUNTER — Ambulatory Visit (INDEPENDENT_AMBULATORY_CARE_PROVIDER_SITE_OTHER): Payer: Medicare Other | Admitting: Adult Health

## 2019-02-22 ENCOUNTER — Encounter: Payer: Self-pay | Admitting: Adult Health

## 2019-02-22 VITALS — BP 130/70 | HR 83 | Temp 98.0°F | Wt 130.2 lb

## 2019-02-22 DIAGNOSIS — F418 Other specified anxiety disorders: Secondary | ICD-10-CM | POA: Diagnosis not present

## 2019-02-22 DIAGNOSIS — Z23 Encounter for immunization: Secondary | ICD-10-CM | POA: Diagnosis not present

## 2019-02-22 MED ORDER — DIAZEPAM 5 MG PO TABS: 5.0000 mg | ORAL_TABLET | Freq: Four times a day (QID) | ORAL | 0 refills | Status: DC | PRN

## 2019-02-22 MED ORDER — MIRTAZAPINE 15 MG PO TABS: 15.0000 mg | ORAL_TABLET | Freq: Every day | ORAL | 1 refills | Status: DC

## 2019-02-22 NOTE — Progress Notes (Signed)
Subjective:    Patient ID: John Holland, male    DOB: September 12, 1950, 68 y.o.   MRN: [id number]  HPI  68 year old male who  has a past medical history of Anxiety, Arthritis, Bipolar 1 disorder ([place]), Depression, Hepatitis C (12/05/2015), and Hyperlipidemia.   He presents to the office today for follow up regarding anxiety and depression. We last spoke via virtual visit last month. At this time he reported increased anxiety depression.  At this time his wife had passed away and his ex-wife who he was close with was full of cancerous tumors.  He was having to be the main caregiver for his ex-wife.  Increased his valium from 5 mg to 10 mg as needed.  Did not want to start depression medication at this time, he was seen by psychiatry in the past and was trialed on Seroquel, Lexapro, Prozac but did not feel as though these medications worked.  Today in the office he reports that since last time we talked his ex-wife had passed away.  When he starts to talk about her he comes becomes very emotional and teary-eyed.  Feels as though his anxiety has become worse as has his depression.  He did not end receiving Valium from 5 to 10 mg.  Denies suicidal ideation.  He does report he is not sleeping well "I sleep in 4-hour increments" and he is not eating much as he has loss of appetite  Wt Readings from Last 3 Encounters:  02/22/19 130 lb 3.2 oz (59.1 kg)  07/06/18 139 lb (63 kg)  10/11/17 142 lb (64.4 kg)    Review of Systems See HPI   Past Medical History:  Diagnosis Date  . Anxiety   . Arthritis   . Bipolar 1 disorder ([place])   . Depression   . Hepatitis C 12/05/2015  . Hyperlipidemia     Social History   Socioeconomic History  . Marital status: Married    Spouse name: Not on file  . Number of children: Not on file  . Years of education: Not on file  . Highest education level: Not on file  Occupational History  . Occupation: Psychologist, occupational: [employer]  Social  Needs  . Financial resource strain: Not on file  . Food insecurity    Worry: Not on file    Inability: Not on file  . Transportation needs    Medical: Not on file    Non-medical: Not on file  Tobacco Use  . Smoking status: Former Smoker    Types: Cigarettes    Quit date: 03/02/2011    Years since quitting: 7.9  . Smokeless tobacco: Never Used  Substance and Sexual Activity  . Alcohol use: No  . Drug use: No  . Sexual activity: Not on file  Lifestyle  . Physical activity    Days per week: Not on file    Minutes per session: Not on file  . Stress: Not on file  Relationships  . Social Herbalist on phone: Not on file    Gets together: Not on file    Attends religious service: Not on file    Active member of club or organization: Not on file    Attends meetings of clubs or organizations: Not on file    Relationship status: Not on file  . Intimate partner violence    Fear of current or ex partner: Not on file    Emotionally abused:  Not on file    Physically abused: Not on file    Forced sexual activity: Not on file  Other Topics Concern  . Not on file  Social History Narrative   Works in a Publishing rights manager     Past Surgical History:  Procedure Laterality Date  . ANKLE FRACTURE SURGERY     age 22-lt  . arthroscopy arm  2003   right elbow bone frag  . COLONOSCOPY    . ROTATOR CUFF REPAIR Right 2015?    Family History  Problem Relation Age of Onset  . COPD Mother   . Heart disease Mother   . Colon cancer Neg Hx   . Stomach cancer Neg Hx     Allergies  Allergen Reactions  . Codeine Phosphate Nausea And Vomiting    Current Outpatient Medications on File Prior to Visit  Medication Sig Dispense Refill  . aspirin 81 MG tablet Take 81 mg by mouth daily.    Marland Kitchen ketoconazole (NIZORAL) 2 % cream   2  . lisinopril (PRINIVIL,ZESTRIL) 10 MG tablet Take 1 tablet (10 mg total) by mouth daily. 90 tablet 3  . nystatin cream (MYCOSTATIN) Apply 1 application  topically 2 (two) times daily. 30 g 0  . simvastatin (ZOCOR) 20 MG tablet TAKE 1 TABLET(20 MG) BY MOUTH AT BEDTIME. 90 tablet 3   No current facility-administered medications on file prior to visit.     BP 130/70 (BP Location: Left Arm, Patient Position: Sitting, Cuff Size: Normal)   Pulse 83   Temp 98 F (36.7 C) (Temporal)   Wt 130 lb 3.2 oz (59.1 kg)   SpO2 98%   BMI 19.23 kg/m       Objective:   Physical Exam Vitals signs and nursing note reviewed.  Constitutional:      Appearance: Normal appearance.  Neurological:     General: No focal deficit present.     Mental Status: He is alert and oriented to person, place, and time.  Psychiatric:        Attention and Perception: Attention and perception normal.        Mood and Affect: Mood normal. Affect is tearful.        Speech: Speech normal.        Behavior: Behavior normal. Behavior is cooperative.        Thought Content: Thought content normal.        Cognition and Memory: Cognition and memory normal.        Judgment: Judgment normal.       Assessment & Plan:  1. Depression with anxiety -We will start on Remeron 15 mg nightly.  With follow-up in 30 days.  He was advised if he has any suicidal ideation then he needs to stop the medication and go to the emergency room.  I will also increase his Valium from 3 times daily as needed to 4 times daily as needed in case he needs an extra dose.  He was cautioned about taking Remeron and valium before bed and was advised not to do so - mirtazapine (REMERON) 15 MG tablet; Take 1 tablet (15 mg total) by mouth at bedtime.  Dispense: 30 tablet; Refill: 1 - diazepam (VALIUM) 5 MG tablet; Take 1 tablet (5 mg total) by mouth every 6 (six) hours as needed for anxiety.  Dispense: 120 tablet; Refill: 0  Dorothyann Peng, NP

## 2019-02-22 NOTE — Patient Instructions (Signed)
It was great seeing you today   I have sent in a new prescription for Valium and also added an antidepresseant called Remeron   Please do not take Valium and Remeron together before going to bed  Follow up in 30 days

## 2019-03-23 ENCOUNTER — Other Ambulatory Visit: Payer: Self-pay

## 2019-03-23 ENCOUNTER — Encounter: Payer: Self-pay | Admitting: Adult Health

## 2019-03-23 ENCOUNTER — Ambulatory Visit (INDEPENDENT_AMBULATORY_CARE_PROVIDER_SITE_OTHER): Payer: Medicare Other | Admitting: Adult Health

## 2019-03-23 VITALS — BP 120/72 | Temp 98.2°F | Wt 138.0 lb

## 2019-03-23 DIAGNOSIS — F418 Other specified anxiety disorders: Secondary | ICD-10-CM | POA: Diagnosis not present

## 2019-03-23 DIAGNOSIS — R682 Dry mouth, unspecified: Secondary | ICD-10-CM

## 2019-03-23 MED ORDER — DIAZEPAM 5 MG PO TABS: 5.0000 mg | ORAL_TABLET | Freq: Four times a day (QID) | ORAL | 1 refills | Status: DC | PRN

## 2019-03-23 MED ORDER — MIRTAZAPINE 15 MG PO TABS: 15.0000 mg | ORAL_TABLET | Freq: Every day | ORAL | 1 refills | Status: DC

## 2019-03-23 NOTE — Progress Notes (Addendum)
Subjective:    Patient ID: John Holland, male    DOB: 04-Mar-1951, 68 y.o.   MRN: [id number]  HPI 68 year old male who  has a past medical history of Anxiety, Arthritis, Bipolar 1 disorder ([place]), Depression, Hepatitis C (12/05/2015), and Hyperlipidemia.  He presents to the office today for follow up regarding anxiety and depression.  During the last visit he was started on Remeron 15 mg for increased depression after his wife and ex-wife both died very close to each other.  This time he was also suffering from insomnia as well as anorexia.  Was continued on Valium 5 mg 3 times daily as needed.  Today he reports "that Remeron is a great pill".  He feels as though his depression has improved significantly, his appetite is back and he is sleeping throughout the night.  The only side effects he has are vivid dreams as well as a dry mouth, both are not enough for him to stop taking this medication.  He continues to take Valium as needed for anxiety.  He has not taking Valium and Remeron together at night  Wt Readings from Last 3 Encounters:  03/23/19 138 lb (62.6 kg)  02/22/19 130 lb 3.2 oz (59.1 kg)  07/06/18 139 lb (63 kg)      Review of Systems See HPI   Past Medical History:  Diagnosis Date  . Anxiety   . Arthritis   . Bipolar 1 disorder ([place])   . Depression   . Hepatitis C 12/05/2015  . Hyperlipidemia     Social History   Socioeconomic History  . Marital status: Married    Spouse name: Not on file  . Number of children: Not on file  . Years of education: Not on file  . Highest education level: Not on file  Occupational History  . Occupation: Psychologist, occupational: [employer]  Social Needs  . Financial resource strain: Not on file  . Food insecurity    Worry: Not on file    Inability: Not on file  . Transportation needs    Medical: Not on file    Non-medical: Not on file  Tobacco Use  . Smoking status: Former Smoker    Types: Cigarettes    Quit  date: 03/02/2011    Years since quitting: 8.0  . Smokeless tobacco: Never Used  Substance and Sexual Activity  . Alcohol use: No  . Drug use: No  . Sexual activity: Not on file  Lifestyle  . Physical activity    Days per week: Not on file    Minutes per session: Not on file  . Stress: Not on file  Relationships  . Social Herbalist on phone: Not on file    Gets together: Not on file    Attends religious service: Not on file    Active member of club or organization: Not on file    Attends meetings of clubs or organizations: Not on file    Relationship status: Not on file  . Intimate partner violence    Fear of current or ex partner: Not on file    Emotionally abused: Not on file    Physically abused: Not on file    Forced sexual activity: Not on file  Other Topics Concern  . Not on file  Social History Narrative   Works in a Publishing rights manager     Past Surgical History:  Procedure Laterality Date  .  ANKLE FRACTURE SURGERY     age 3-lt  . arthroscopy arm  2003   right elbow bone frag  . COLONOSCOPY    . ROTATOR CUFF REPAIR Right 2015?    Family History  Problem Relation Age of Onset  . COPD Mother   . Heart disease Mother   . Colon cancer Neg Hx   . Stomach cancer Neg Hx     Allergies  Allergen Reactions  . Codeine Phosphate Nausea And Vomiting    Current Outpatient Medications on File Prior to Visit  Medication Sig Dispense Refill  . aspirin 81 MG tablet Take 81 mg by mouth daily.    Marland Kitchen ketoconazole (NIZORAL) 2 % cream   2  . lisinopril (PRINIVIL,ZESTRIL) 10 MG tablet Take 1 tablet (10 mg total) by mouth daily. 90 tablet 3  . nystatin cream (MYCOSTATIN) Apply 1 application topically 2 (two) times daily. 30 g 0  . simvastatin (ZOCOR) 20 MG tablet TAKE 1 TABLET(20 MG) BY MOUTH AT BEDTIME. 90 tablet 3   No current facility-administered medications on file prior to visit.     BP 120/72   Temp 98.2 F (36.8 C)   Wt 138 lb (62.6 kg)   BMI  20.38 kg/m       Objective:   Physical Exam Vitals signs and nursing note reviewed.  Constitutional:      Appearance: Normal appearance.  Abdominal:     General: Abdomen is flat.  Musculoskeletal: Normal range of motion.        General: No swelling, tenderness, deformity or signs of injury.     Right lower leg: No edema.     Left lower leg: No edema.  Skin:    General: Skin is warm and dry.     Coloration: Skin is not jaundiced or pale.     Findings: No bruising, erythema, lesion or rash.  Neurological:     General: No focal deficit present.     Mental Status: He is alert and oriented to person, place, and time.     Cranial Nerves: No cranial nerve deficit.     Sensory: No sensory deficit.     Motor: No weakness.     Coordination: Coordination normal.     Gait: Gait normal.     Deep Tendon Reflexes: Reflexes normal.  Psychiatric:        Mood and Affect: Mood normal.        Behavior: Behavior normal.        Thought Content: Thought content normal.        Judgment: Judgment normal.        Assessment & Plan:  1. Depression with anxiety -Depression and anxiety as well as side effects of anorexia and insomnia have improved tremendously.  We will continue with current medication regimen.  He was advised to follow-up as needed - mirtazapine (REMERON) 15 MG tablet; Take 1 tablet (15 mg total) by mouth at bedtime.  Dispense: 90 tablet; Refill: 1 - diazepam (VALIUM) 5 MG tablet; Take 1 tablet (5 mg total) by mouth every 6 (six) hours as needed for anxiety.  Dispense: 120 tablet; Refill: 1   2. Dry mouth -He can suck on hard candies or use Biotene mouthwash twice daily.   Dorothyann Peng, NP

## 2019-06-06 ENCOUNTER — Other Ambulatory Visit: Payer: Self-pay | Admitting: Adult Health

## 2019-06-06 DIAGNOSIS — F418 Other specified anxiety disorders: Secondary | ICD-10-CM

## 2019-06-28 ENCOUNTER — Ambulatory Visit: Payer: Medicare Other | Attending: Internal Medicine

## 2019-06-28 DIAGNOSIS — Z23 Encounter for immunization: Secondary | ICD-10-CM | POA: Insufficient documentation

## 2019-06-28 NOTE — Progress Notes (Signed)
   Covid-19 Vaccination Clinic  Name:  MELACHI TEES    MRN: [id number] DOB: 02-27-1951  06/28/2019  Mr. Audet was observed post Covid-19 immunization for 15 minutes without incidence. He was provided with Vaccine Information Sheet and instruction to access the V-Safe system.   Mr. Bicknese was instructed to call 911 with any severe reactions post vaccine: Marland Kitchen Difficulty breathing  . Swelling of your face and throat  . A fast heartbeat  . A bad rash all over your body  . Dizziness and weakness    Immunizations Administered    Name Date Dose VIS Date Route   Pfizer COVID-19 Vaccine 06/28/2019  1:30 PM 0.3 mL 05/18/2019 Intramuscular   Manufacturer: [place]   Lot: [id number]   [place]: [id number]

## 2019-07-18 ENCOUNTER — Other Ambulatory Visit: Payer: Self-pay | Admitting: Adult Health

## 2019-07-18 DIAGNOSIS — F418 Other specified anxiety disorders: Secondary | ICD-10-CM

## 2019-07-19 ENCOUNTER — Ambulatory Visit: Payer: Medicare Other | Attending: Internal Medicine

## 2019-07-19 DIAGNOSIS — Z23 Encounter for immunization: Secondary | ICD-10-CM

## 2019-07-19 NOTE — Telephone Encounter (Signed)
Okay for refill?   Last ov 03/2020 for dry mouth

## 2019-07-19 NOTE — Progress Notes (Signed)
   Covid-19 Vaccination Clinic  Name:  VONDELL QUISPE    MRN: [id number] DOB: 1951/03/27  07/19/2019  Mr. Checchi was observed post Covid-19 immunization for 15 minutes without incidence. He was provided with Vaccine Information Sheet and instruction to access the V-Safe system.   Mr. Melchert was instructed to call 911 with any severe reactions post vaccine: Marland Kitchen Difficulty breathing  . Swelling of your face and throat  . A fast heartbeat  . A bad rash all over your body  . Dizziness and weakness    Immunizations Administered    Name Date Dose VIS Date Route   Pfizer COVID-19 Vaccine 07/19/2019  1:45 PM 0.3 mL 05/18/2019 Intramuscular   Manufacturer: [place]   Lot: [id number]   [place]: [id number]

## 2019-07-21 ENCOUNTER — Other Ambulatory Visit: Payer: Self-pay | Admitting: Adult Health

## 2019-07-21 DIAGNOSIS — I10 Essential (primary) hypertension: Secondary | ICD-10-CM

## 2019-08-16 ENCOUNTER — Telehealth: Payer: Self-pay | Admitting: Adult Health

## 2019-08-16 NOTE — Telephone Encounter (Signed)
Pt would like a call back he is experiencing numbness in his right hand. He would like to know what to do, no pain.

## 2019-08-16 NOTE — Telephone Encounter (Signed)
Spoke to the pt.  He said the problem started last month with tingling in his right hand and has become worse.  His stated his hand now feels like it is asleep.  He is not capable of a virtual visit.  Pt scheduled to see Tommi Rumps in the office on 08/17/2019 @ 2 PM.  Will forward to [place] as FYI.

## 2019-08-17 ENCOUNTER — Other Ambulatory Visit: Payer: Self-pay

## 2019-08-17 ENCOUNTER — Ambulatory Visit (INDEPENDENT_AMBULATORY_CARE_PROVIDER_SITE_OTHER): Payer: Medicare Other

## 2019-08-17 ENCOUNTER — Encounter: Payer: Self-pay | Admitting: Adult Health

## 2019-08-17 ENCOUNTER — Ambulatory Visit (INDEPENDENT_AMBULATORY_CARE_PROVIDER_SITE_OTHER): Payer: Medicare Other | Admitting: Adult Health

## 2019-08-17 ENCOUNTER — Ambulatory Visit: Payer: Medicare Other

## 2019-08-17 VITALS — BP 116/64 | Temp 97.5°F | Wt 139.0 lb

## 2019-08-17 DIAGNOSIS — R202 Paresthesia of skin: Secondary | ICD-10-CM

## 2019-08-17 DIAGNOSIS — R200 Anesthesia of skin: Secondary | ICD-10-CM

## 2019-08-17 DIAGNOSIS — M5030 Other cervical disc degeneration, unspecified cervical region: Secondary | ICD-10-CM | POA: Diagnosis not present

## 2019-08-17 MED ORDER — PREDNISONE 20 MG PO TABS: 20.0000 mg | ORAL_TABLET | Freq: Every day | ORAL | 0 refills | Status: DC

## 2019-08-17 NOTE — Progress Notes (Signed)
Subjective:    Patient ID: John Holland, male    DOB: 12-Jan-1951, 69 y.o.   MRN: [id number]  HPI 69 year old male who  has a past medical history of Anxiety, Arthritis, Bipolar 1 disorder ([place]), Depression, Hepatitis C (12/05/2015), and Hyperlipidemia.   He is being evaluated today for an acute issue that has been present for 1 month.  He reports constant mild numbness and tingling on the palmar side of his right hand.  This involves his third fourth and fifth digits down to his wrist.  He denies paresthesia that is worse at night.  Denies decrease in grip strength or loss of ADLs.  He has not had any trauma or aggravating factor.  Nothing he does seems to relieve the numbness and tingling.  He denies numbness and tingling elsewhere in his right upper extremity.  Has no loss of range of motion of right shoulder and no cervical spine pain  Review of Systems See HPI   Past Medical History:  Diagnosis Date  . Anxiety   . Arthritis   . Bipolar 1 disorder ([place])   . Depression   . Hepatitis C 12/05/2015  . Hyperlipidemia     Social History   Socioeconomic History  . Marital status: Married    Spouse name: Not on file  . Number of children: Not on file  . Years of education: Not on file  . Highest education level: Not on file  Occupational History  . Occupation: Psychologist, occupational: [employer]  Tobacco Use  . Smoking status: Former Smoker    Types: Cigarettes    Quit date: 03/02/2011    Years since quitting: 8.4  . Smokeless tobacco: Never Used  Substance and Sexual Activity  . Alcohol use: No  . Drug use: No  . Sexual activity: Not on file  Other Topics Concern  . Not on file  Social History Narrative   Works in a [employer] Strain:   . Difficulty of Paying Living Expenses:   Food Insecurity:   . Worried About Charity fundraiser in the Last Year:   . Arboriculturist in the Last  Year:   Transportation Needs:   . Film/video editor (Medical):   Marland Kitchen Lack of Transportation (Non-Medical):   Physical Activity:   . Days of Exercise per Week:   . Minutes of Exercise per Session:   Stress:   . Feeling of Stress :   Social Connections:   . Frequency of Communication with Friends and Family:   . Frequency of Social Gatherings with Friends and Family:   . Attends Religious Services:   . Active Member of Clubs or Organizations:   . Attends Archivist Meetings:   Marland Kitchen Marital Status:   Intimate Partner Violence:   . Fear of Current or Ex-Partner:   . Emotionally Abused:   Marland Kitchen Physically Abused:   . Sexually Abused:     Past Surgical History:  Procedure Laterality Date  . ANKLE FRACTURE SURGERY     age 27-lt  . arthroscopy arm  2003   right elbow bone frag  . COLONOSCOPY    . ROTATOR CUFF REPAIR Right 2015?    Family History  Problem Relation Age of Onset  . COPD Mother   . Heart disease Mother   . Colon cancer Neg Hx   . Stomach cancer  Neg Hx     Allergies  Allergen Reactions  . Codeine Phosphate Nausea And Vomiting    Current Outpatient Medications on File Prior to Visit  Medication Sig Dispense Refill  . aspirin 81 MG tablet Take 81 mg by mouth daily.    . diazepam (VALIUM) 5 MG tablet TAKE 1 TABLET(5 MG) BY MOUTH EVERY 6 HOURS AS NEEDED FOR ANXIETY 120 tablet 1  . lisinopril (ZESTRIL) 10 MG tablet TAKE 1 TABLET(10 MG) BY MOUTH DAILY 90 tablet 3  . mirtazapine (REMERON) 15 MG tablet Take 1 tablet (15 mg total) by mouth at bedtime. 90 tablet 1  . nystatin cream (MYCOSTATIN) Apply 1 application topically 2 (two) times daily. 30 g 0  . simvastatin (ZOCOR) 20 MG tablet TAKE 1 TABLET(20 MG) BY MOUTH AT BEDTIME. 90 tablet 3   No current facility-administered medications on file prior to visit.    BP 116/64   Temp (!) 97.5 F (36.4 C) (Temporal)   Wt 139 lb (63 kg)   BMI 20.53 kg/m       Objective:   Physical Exam Vitals and nursing  note reviewed.  Constitutional:      Appearance: Normal appearance.  Cardiovascular:     Rate and Rhythm: Normal rate and regular rhythm.     Pulses: Normal pulses.     Heart sounds: Normal heart sounds.  Pulmonary:     Effort: Pulmonary effort is normal.     Breath sounds: Normal breath sounds.  Musculoskeletal:        General: No swelling or tenderness. Normal range of motion.  Skin:    General: Skin is warm and dry.     Capillary Refill: Capillary refill takes less than 2 seconds.  Neurological:     General: No focal deficit present.     Mental Status: He is alert and oriented to person, place, and time.     Sensory: No sensory deficit.     Motor: No weakness.     Comments: Equal grip strength bilaterally.  Negative Phalen and Tinel's    Psychiatric:        Mood and Affect: Mood normal.        Behavior: Behavior normal.        Thought Content: Thought content normal.        Judgment: Judgment normal.       Assessment & Plan:  1. Numbness and tingling in right hand -Does not appear to be circulation or bacterial in nature.  [place] due to cervical disc issue.  Will start off with x-ray.  He is okay with trialing prednisone.  Does not want an extensive work-up at this time - DG Cervical Spine Complete; Future - predniSONE (DELTASONE) 20 MG tablet; Take 1 tablet (20 mg total) by mouth daily with breakfast.  Dispense: 10 tablet; Refill: 0 - DG Cervical Spine Complete   Dorothyann Peng, NP

## 2019-08-21 ENCOUNTER — Telehealth: Payer: Self-pay | Admitting: Adult Health

## 2019-08-21 ENCOUNTER — Other Ambulatory Visit: Payer: Self-pay | Admitting: Adult Health

## 2019-08-21 DIAGNOSIS — M25551 Pain in right hip: Secondary | ICD-10-CM

## 2019-08-21 DIAGNOSIS — M25552 Pain in left hip: Secondary | ICD-10-CM

## 2019-08-21 NOTE — Telephone Encounter (Signed)
Updated patient on his x-ray results which showed  IMPRESSION: 1. 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 2. 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  He does not wish to be seen by orthopedics at this time and will follow-up as needed

## 2019-09-21 ENCOUNTER — Other Ambulatory Visit: Payer: Self-pay | Admitting: Adult Health

## 2019-09-21 DIAGNOSIS — E785 Hyperlipidemia, unspecified: Secondary | ICD-10-CM

## 2019-09-21 NOTE — Telephone Encounter (Signed)
[place] TO THE PHARMACY.

## 2019-10-15 ENCOUNTER — Other Ambulatory Visit: Payer: Self-pay | Admitting: Adult Health

## 2019-10-15 DIAGNOSIS — F418 Other specified anxiety disorders: Secondary | ICD-10-CM

## 2019-10-25 ENCOUNTER — Other Ambulatory Visit: Payer: Self-pay | Admitting: Adult Health

## 2019-10-25 DIAGNOSIS — F418 Other specified anxiety disorders: Secondary | ICD-10-CM

## 2019-10-26 ENCOUNTER — Other Ambulatory Visit: Payer: Self-pay | Admitting: Adult Health

## 2019-10-26 DIAGNOSIS — E785 Hyperlipidemia, unspecified: Secondary | ICD-10-CM

## 2019-10-26 NOTE — Telephone Encounter (Signed)
DENIED.  PT IS PAST DUE FOR CPX AND LAB WORK. 

## 2020-01-08 ENCOUNTER — Other Ambulatory Visit: Payer: Self-pay | Admitting: Adult Health

## 2020-01-08 DIAGNOSIS — F418 Other specified anxiety disorders: Secondary | ICD-10-CM

## 2020-02-12 ENCOUNTER — Encounter: Payer: Self-pay | Admitting: Adult Health

## 2020-02-12 ENCOUNTER — Ambulatory Visit (INDEPENDENT_AMBULATORY_CARE_PROVIDER_SITE_OTHER): Payer: Medicare Other | Admitting: Adult Health

## 2020-02-12 ENCOUNTER — Other Ambulatory Visit: Payer: Self-pay

## 2020-02-12 VITALS — BP 118/80 | HR 84 | Temp 97.7°F | Wt 139.0 lb

## 2020-02-12 DIAGNOSIS — M25512 Pain in left shoulder: Secondary | ICD-10-CM

## 2020-02-12 MED ORDER — METHYLPREDNISOLONE ACETATE 80 MG/ML IJ SUSP
80.0000 mg | Freq: Once | INTRAMUSCULAR | Status: AC
  Administered 2020-02-12: 80 mg via INTRAMUSCULAR

## 2020-02-12 NOTE — Progress Notes (Signed)
Subjective:    Patient ID: John Holland, male    DOB: [birth year], 69 y.o.   MRN: [id number]  HPI 69 year old male who  has a past medical history of Anxiety, Arthritis, Bipolar 1 disorder ([place]), Depression, Hepatitis C (12/05/2015), and Hyperlipidemia.  He presents to the office today for an issue of left-sided shoulder pain.  Back in 2015 he had right sided rotator cuff repair done and feels as though the symptoms are similar.  He has had some sort of discomfort in his left shoulder for some time now but last week he was moving a heavy object in his yard and had worsening discomfort in his left shoulder.  Over the weekend the discomfort did not improve much.  He reports loss of range of motion and pain with certain movements.   Review of Systems See HPI   Past Medical History:  Diagnosis Date   Anxiety    Arthritis    Bipolar 1 disorder ([place])    Depression    Hepatitis C 12/05/2015   Hyperlipidemia     Social History   Socioeconomic History   Marital status: Married    Spouse name: Not on file   Number of children: Not on file   Years of education: Not on file   Highest education level: Not on file  Occupational History   Occupation: Material Therapist, sports: [employer]  Tobacco Use   Smoking status: Former Smoker    Types: Cigarettes    Quit date: 03/02/2011    Years since quitting: 8.9   Smokeless tobacco: Never Used  Substance and Sexual Activity   Alcohol use: No   Drug use: No   Sexual activity: Not on file  Other Topics Concern   Not on file  Social History Narrative   Works in a [place] Strain:    Difficulty of Paying Living Expenses: Not on file  Food Insecurity:    Worried About Charity fundraiser in the Last Year: Not on file   [place] in the Last Year: Not on file  Transportation Needs:    Lack of Transportation (Medical): Not on file    Lack of Transportation (Non-Medical): Not on file  Physical Activity:    Days of Exercise per Week: Not on file   Minutes of Exercise per Session: Not on file  Stress:    Feeling of Stress : Not on file  Social Connections:    Frequency of Communication with Friends and Family: Not on file   Frequency of Social Gatherings with Friends and Family: Not on file   Attends Religious Services: Not on file   Active Member of Clubs or Organizations: Not on file   Attends Archivist Meetings: Not on file   Marital Status: Not on file  Intimate Partner Violence:    Fear of Current or Ex-Partner: Not on file   Emotionally Abused: Not on file   Physically Abused: Not on file   Sexually Abused: Not on file    Past Surgical History:  Procedure Laterality Date   ANKLE FRACTURE SURGERY     age 46-lt   arthroscopy arm  2003   right elbow bone frag   COLONOSCOPY     ROTATOR CUFF REPAIR Right 2015?    Family History  Problem Relation Age of Onset   COPD Mother  Heart disease Mother    Colon cancer Neg Hx    Stomach cancer Neg Hx     Allergies  Allergen Reactions   Codeine Phosphate Nausea And Vomiting    Current Outpatient Medications on File Prior to Visit  Medication Sig Dispense Refill   aspirin 81 MG tablet Take 81 mg by mouth daily.     diazepam (VALIUM) 5 MG tablet TAKE 1 TABLET(5 MG) BY MOUTH EVERY 6 HOURS AS NEEDED FOR ANXIETY 120 tablet 1   lisinopril (ZESTRIL) 10 MG tablet TAKE 1 TABLET(10 MG) BY MOUTH DAILY 90 tablet 3   mirtazapine (REMERON) 15 MG tablet TAKE 1 TABLET(15 MG) BY MOUTH AT BEDTIME 90 tablet 1   nystatin cream (MYCOSTATIN) Apply 1 application topically 2 (two) times daily. 30 g 0   simvastatin (ZOCOR) 20 MG tablet TAKE 1 TABLET(20 MG) BY MOUTH AT BEDTIME.  **DUE FOR YEARLY PHYSICAL** 30 tablet 0   No current facility-administered medications on file prior to visit.    BP 118/80 (BP Location: Left Arm, Patient  Position: Sitting, Cuff Size: Normal)    Pulse 84    Temp 97.7 F (36.5 C) (Oral)    Wt 139 lb (63 kg)    SpO2 98%    BMI 20.53 kg/m       Objective:   Physical Exam Vitals and nursing note reviewed.  Constitutional:      Appearance: Normal appearance.  Musculoskeletal:        General: Tenderness present. No swelling or deformity.     Right shoulder: Normal.     Left shoulder: Tenderness and bony tenderness present. No crepitus. Decreased range of motion. Decreased strength.     Comments: Positive back scratch test, empty can test, resistance testing, and unable to lift arm above his head.  He had decreased grip strength in his left at 3 out of 5  Skin:    General: Skin is warm and dry.  Neurological:     Mental Status: He is alert.       Assessment & Plan:  1. Left shoulder pain, unspecified chronicity .  Likely rotator cuff injury.  Discussed treatment options.  He would like to proceed with a steroid injection as well as referral to [facility]. Shoulder injection Verbal consent obtained and verified. Sterile betadine prep. Furthur cleansed with alcohol. Topical analgesic spray: Ethyl chloride. Joint: left subacromial injection Approached in typical fashion with: posterior approach Completed without difficulty Meds: 3 cc lidocaine 2% no epi, 1 cc depomedrol 80mg /cc Needle:1.5 inch 25 gauge Aftercare instructions and Red flags advised. Immediate improvement in pain noted  - methylPREDNISolone acetate (DEPO-MEDROL) injection 80 mg - AMB referral to orthopedics   Dorothyann Peng, NP

## 2020-02-19 DIAGNOSIS — M25512 Pain in left shoulder: Secondary | ICD-10-CM | POA: Diagnosis not present

## 2020-02-26 ENCOUNTER — Telehealth: Payer: Self-pay | Admitting: Adult Health

## 2020-02-26 NOTE — Telephone Encounter (Signed)
Left message for patient to schedule Annual Wellness Visit.  Please schedule with Nurse Health Advisor Shannon Crews, RN at [facility]  

## 2020-03-06 DIAGNOSIS — M25512 Pain in left shoulder: Secondary | ICD-10-CM | POA: Diagnosis not present

## 2020-03-11 ENCOUNTER — Ambulatory Visit (INDEPENDENT_AMBULATORY_CARE_PROVIDER_SITE_OTHER): Payer: Medicare Other

## 2020-03-11 ENCOUNTER — Encounter: Payer: Self-pay | Admitting: Family Medicine

## 2020-03-11 ENCOUNTER — Ambulatory Visit (INDEPENDENT_AMBULATORY_CARE_PROVIDER_SITE_OTHER): Payer: Medicare Other | Admitting: Family Medicine

## 2020-03-11 VITALS — BP 168/90 | HR 66 | Temp 98.2°F | Ht 69.0 in | Wt 139.1 lb

## 2020-03-11 DIAGNOSIS — W57XXXA Bitten or stung by nonvenomous insect and other nonvenomous arthropods, initial encounter: Secondary | ICD-10-CM

## 2020-03-11 DIAGNOSIS — Z23 Encounter for immunization: Secondary | ICD-10-CM

## 2020-03-11 DIAGNOSIS — S60861A Insect bite (nonvenomous) of right wrist, initial encounter: Secondary | ICD-10-CM

## 2020-03-11 DIAGNOSIS — Z0000 Encounter for general adult medical examination without abnormal findings: Secondary | ICD-10-CM

## 2020-03-11 DIAGNOSIS — I10 Essential (primary) hypertension: Secondary | ICD-10-CM

## 2020-03-11 MED ORDER — MUPIROCIN 2 % EX OINT: 1.0000 "application " | TOPICAL_OINTMENT | Freq: Two times a day (BID) | CUTANEOUS | 0 refills | Status: DC

## 2020-03-11 NOTE — Patient Instructions (Addendum)

## 2020-03-11 NOTE — Progress Notes (Addendum)
Subjective:   John Holland is a 69 y.o. male who presents for an Initial Medicare Annual Wellness Visit. I connected with John Holland  today by telephone and verified that I am speaking with the correct person using two identifiers. Location patient: home Location provider: work Persons participating in the virtual visit: patient, provider.   I discussed the limitations, risks, security and privacy concerns of performing an evaluation and management service by telephone and the availability of in person appointments. I also discussed with the patient that there may be a patient responsible charge related to this service. The patient expressed understanding and verbally consented to this telephonic visit.    Interactive audio and video telecommunications were attempted between this provider and patient, however failed, due to patient having technical difficulties OR patient did not have access to video capability.  We continued and completed visit with audio only.      Review of Systems    N/A  Cardiac Risk Factors include: advanced age (>59men, >35 women);male gender;hypertension     Objective:    Today's Vitals   There is no height or weight on file to calculate BMI.  Advanced Directives 03/11/2020 11/05/2011  Does Patient Have a Medical Advance Directive? No Patient does not have advance directive  Would patient like information on creating a medical advance directive? No - Patient declined -    Current Medications (verified) Outpatient Encounter Medications as of 03/11/2020  Medication Sig  . aspirin 81 MG tablet Take 81 mg by mouth daily.  . diazepam (VALIUM) 5 MG tablet TAKE 1 TABLET(5 MG) BY MOUTH EVERY 6 HOURS AS NEEDED FOR ANXIETY  . lisinopril (ZESTRIL) 10 MG tablet TAKE 1 TABLET(10 MG) BY MOUTH DAILY  . mirtazapine (REMERON) 15 MG tablet TAKE 1 TABLET(15 MG) BY MOUTH AT BEDTIME  . nystatin cream (MYCOSTATIN) Apply 1 application topically 2 (two) times daily.  .  simvastatin (ZOCOR) 20 MG tablet TAKE 1 TABLET(20 MG) BY MOUTH AT BEDTIME.  **DUE FOR YEARLY PHYSICAL**   No facility-administered encounter medications on file as of 03/11/2020.    Allergies (verified) Codeine phosphate   History: Past Medical History:  Diagnosis Date  . Anxiety   . Arthritis   . Bipolar 1 disorder ([place])   . Depression   . Hepatitis C 12/05/2015  . Hyperlipidemia    Past Surgical History:  Procedure Laterality Date  . ANKLE FRACTURE SURGERY     age 80-lt  . arthroscopy arm  2003   right elbow bone frag  . COLONOSCOPY    . ROTATOR CUFF REPAIR Right 2015?   Family History  Problem Relation Age of Onset  . COPD Mother   . Heart disease Mother   . Colon cancer Neg Hx   . Stomach cancer Neg Hx    Social History   Socioeconomic History  . Marital status: Married    Spouse name: Not on file  . Number of children: Not on file  . Years of education: Not on file  . Highest education level: Not on file  Occupational History  . Occupation: Psychologist, occupational: [employer]  Tobacco Use  . Smoking status: Former Smoker    Types: Cigarettes    Quit date: 03/02/2011    Years since quitting: 9.0  . Smokeless tobacco: Never Used  Substance and Sexual Activity  . Alcohol use: No  . Drug use: No  . Sexual activity: Not on file  Other Topics Concern  .  Not on file  Social History Narrative   Works in a [place]rain: [place]   . Difficulty of Paying Living Expenses: Not hard at all  Food Insecurity: No Food Insecurity  . Worried About Charity fundraiser in the Last Year: Never true  . Ran Out of Food in the Last Year: Never true  Transportation Needs: No Transportation Needs  . Lack of Transportation (Medical): No  . Lack of Transportation (Non-Medical): No  Physical Activity: Inactive  . Days of Exercise per Week: 0 days  . Minutes of Exercise per Session: 0 min    Stress: No Stress Concern Present  . Feeling of Stress : Not at all  Social Connections: Moderately Isolated  . Frequency of Communication with Friends and Family: More than three times a week  . Frequency of Social Gatherings with Friends and Family: Twice a week  . Attends Religious Services: More than 4 times per year  . Active Member of Clubs or Organizations: No  . Attends Archivist Meetings: Never  . Marital Status: Widowed    Tobacco Counseling Counseling given: Not Answered   Clinical Intake:  Pre-visit preparation completed: Yes  Pain : No/denies pain     Nutritional Risks: None Diabetes: No  How often do you need to have someone help you when you read instructions, pamphlets, or other written materials from your doctor or pharmacy?: 1 - Never What is the last grade level you completed in school?: 12th grade  Diabetic?No Interpreter Needed?: No  Information entered by :: [place] of Daily Living In your present state of health, do you have any difficulty performing the following activities: 03/11/2020  Hearing? N  Vision? N  Difficulty concentrating or making decisions? N  Walking or climbing stairs? N  Dressing or bathing? N  Doing errands, shopping? N  Preparing Food and eating ? N  Using the Toilet? N  In the past six months, have you accidently leaked urine? N  Do you have problems with loss of bowel control? N  Managing your Medications? N  Managing your Finances? N  Housekeeping or managing your Housekeeping? N  Some recent data might be hidden    Patient Care Team: Dorothyann Peng, NP as PCP - General (Family Medicine)  Indicate any recent Medical Services you may have received from other than [facility] providers in the past year (date may be approximate).     Assessment:   This is a routine wellness examination for John Holland.  Hearing/Vision screen  Hearing Screening   125Hz  250Hz  500Hz  1000Hz  2000Hz  3000Hz  4000Hz   6000Hz  8000Hz   Right ear:           Left ear:           Vision Screening Comments: Patient states gets eye examined yearly   Dietary issues and exercise activities discussed: Current Exercise Habits: The patient does not participate in regular exercise at present, Exercise limited by: None identified  Goals    . Exercise 3x per week (30 min per time)      Depression Screen PHQ 2/9 Scores 03/11/2020 03/23/2019 07/06/2018 09/21/2016 03/23/2016 12/17/2015 12/17/2015  PHQ - 2 Score 0 1 0 0 0 0 0  PHQ- 9 Score 0 - - - - - -    Fall Risk Fall Risk  03/11/2020 07/06/2018 04/25/2018 01/06/2018 09/21/2016  Falls in the past year? 0 0 0 No  No  Comment - - Emmi Telephone Survey: data to providers prior to load Emmi Telephone Survey: data to providers prior to load -  Number falls in past yr: 0 - - - -  Injury with Fall? 0 - - - -  Risk for fall due to : No Fall Risks - - - -  Follow up Falls evaluation completed;Falls prevention discussed - - - -    Any stairs in or around the home? No  If so, are there any without handrails? No  Home free of loose throw rugs in walkways, pet beds, electrical cords, etc? Yes  Adequate lighting in your home to reduce risk of falls? Yes   ASSISTIVE DEVICES UTILIZED TO PREVENT FALLS:  Life alert? No  Use of a cane, walker or w/c? No  Grab bars in the bathroom? No  Shower chair or bench in shower? No  Elevated toilet seat or a handicapped toilet? No    Cognitive Function:  Patient alert and oriented x4 cognitive screening not indicated      Immunizations Immunization History  Administered Date(s) Administered  . Fluad Quad(high Dose 65+) 02/22/2019  . Influenza, High Dose Seasonal PF 04/08/2017  . Influenza,inj,Quad PF,6+ Mos 03/23/2016  . Influenza-Unspecified 01/05/2018  . PFIZER SARS-COV-2 Vaccination 06/28/2019, 07/19/2019  . Pneumococcal Conjugate-13 10/17/2015  . Pneumococcal Polysaccharide-23 12/07/2016  . Td 06/07/2006    TDAP status: Due,  Education has been provided regarding the importance of this vaccine. Advised may receive this vaccine at local pharmacy or Health Dept. Aware to provide a copy of the vaccination record if obtained from local pharmacy or Health Dept. Verbalized acceptance and understanding. Flu Vaccine status: Declined, Education has been provided regarding the importance of this vaccine but patient still declined. Advised may receive this vaccine at local pharmacy or Health Dept. Aware to provide a copy of the vaccination record if obtained from local pharmacy or Health Dept. Verbalized acceptance and understanding. Pneumococcal vaccine status: Up to date Covid-19 vaccine status: Completed vaccines  Qualifies for Shingles Vaccine? Yes   Zostavax completed No   Shingrix Completed?: No.    Education has been provided regarding the importance of this vaccine. Patient has been advised to call insurance company to determine out of pocket expense if they have not yet received this vaccine. Advised may also receive vaccine at local pharmacy or Health Dept. Verbalized acceptance and understanding.  Screening Tests Health Maintenance  Topic Date Due  . INFLUENZA VACCINE  01/06/2020  . COLONOSCOPY  10/26/2021  . COVID-19 Vaccine  Completed  . Hepatitis C Screening  Completed  . PNA vac Low Risk Adult  Completed    Health Maintenance  Health Maintenance Due  Topic Date Due  . INFLUENZA VACCINE  01/06/2020    Colorectal cancer screening: Completed 10/27/2011. Repeat every 10 years  Lung Cancer Screening: (Low Dose CT Chest recommended if Age 8-80 years, 30 pack-year currently smoking OR have quit w/in 15years.) does not qualify.   Lung Cancer Screening Referral: N/A   Additional Screening:  Hepatitis C Screening: does qualify; Completed 09/21/2016  Vision Screening: Recommended annual ophthalmology exams for early detection of glaucoma and other disorders of the eye. Is the patient up to date with their  annual eye exam?  Yes  Who is the provider or what is the name of the office in which the patient attends annual eye exams? [facility] If pt is not established with a provider, would they like to be referred to a  provider to establish care? No .   Dental Screening: Recommended annual dental exams for proper oral hygiene  Community Resource Referral / Chronic Care Management: CRR required this visit?  No   CCM required this visit?  No      Plan:     I have personally reviewed and noted the following in the patient's chart:   . Medical and social history . Use of alcohol, tobacco or illicit drugs  . Current medications and supplements . Functional ability and status . Nutritional status . Physical activity . Advanced directives . List of other physicians . Hospitalizations, surgeries, and ER visits in previous 12 months . Vitals . Screenings to include cognitive, depression, and falls . Referrals and appointments  In addition, I have reviewed and discussed with patient certain preventive protocols, quality metrics, and best practice recommendations. A written personalized care plan for preventive services as well as general preventive health recommendations were provided to patient.     Ofilia Neas, LPN   01/0/0712   Nurse Notes: None   I have collaborated with the care management provider regarding care management and care coordination activities outlined in this encounter and have reviewed this encounter including documentation in the note and care plan. I am certifying that I agree with the content of this note and encounter as supervising physician.  Dorothyann Peng, NP

## 2020-03-11 NOTE — Progress Notes (Signed)
Established Patient Office Visit  Subjective:  Patient ID: John Holland, male    DOB: 1950/07/29  Age: 69 y.o. MRN: [id number]  CC: No chief complaint on file.   HPI John Holland presents for evaluation of irritated area dorsum right wrist.  He states about 6 to 8 weeks ago he had a small deer tick bitten into that area.  He feels like he was able to remove the tick entirely.  He had some itching and mild swelling since then but he has excoriated the area several times.  He has not had any drainage.  No systemic symptoms such as headache, myalgia, arthralgia, or any skin rash.  He has history of hypertension.  He takes lisinopril.  Does not monitor blood pressure at home.  No recent chest pains, dizziness, or headaches.  Blood pressure is significantly up today.  No alcohol use.  No regular nonsteroidal use.  He states he has been compliant with lisinopril recently  Past Medical History:  Diagnosis Date  . Anxiety   . Arthritis   . Bipolar 1 disorder ([place])   . Depression   . Hepatitis C 12/05/2015  . Hyperlipidemia     Past Surgical History:  Procedure Laterality Date  . ANKLE FRACTURE SURGERY     age 83-lt  . arthroscopy arm  2003   right elbow bone frag  . COLONOSCOPY    . ROTATOR CUFF REPAIR Right 2015?    Family History  Problem Relation Age of Onset  . COPD Mother   . Heart disease Mother   . Colon cancer Neg Hx   . Stomach cancer Neg Hx     Social History   Socioeconomic History  . Marital status: Married    Spouse name: Not on file  . Number of children: Not on file  . Years of education: Not on file  . Highest education level: Not on file  Occupational History  . Occupation: Psychologist, occupational: [employer]  Tobacco Use  . Smoking status: Former Smoker    Types: Cigarettes    Quit date: 03/02/2011    Years since quitting: 9.0  . Smokeless tobacco: Never Used  Substance and Sexual Activity  . Alcohol use: No  . Drug use: No  .  Sexual activity: Not on file  Other Topics Concern  . Not on file  Social History Narrative   Works in a [place] Strain: [place]   . Difficulty of Paying Living Expenses: Not hard at all  Food Insecurity: No Food Insecurity  . Worried About Charity fundraiser in the Last Year: Never true  . Ran Out of Food in the Last Year: Never true  Transportation Needs: No Transportation Needs  . Lack of Transportation (Medical): No  . Lack of Transportation (Non-Medical): No  Physical Activity: Inactive  . Days of Exercise per Week: 0 days  . Minutes of Exercise per Session: 0 min  Stress: No Stress Concern Present  . Feeling of Stress : Not at all  Social Connections: Moderately Isolated  . Frequency of Communication with Friends and Family: More than three times a week  . Frequency of Social Gatherings with Friends and Family: Twice a week  . Attends Religious Services: More than 4 times per year  . Active Member of Clubs or Organizations: No  . Attends Archivist Meetings: Never  .  Marital Status: Widowed  Intimate Partner Violence: Not At Risk  . Fear of Current or Ex-Partner: No  . Emotionally Abused: No  . Physically Abused: No  . Sexually Abused: No    Outpatient Medications Prior to Visit  Medication Sig Dispense Refill  . aspirin 81 MG tablet Take 81 mg by mouth daily.    . diazepam (VALIUM) 5 MG tablet TAKE 1 TABLET(5 MG) BY MOUTH EVERY 6 HOURS AS NEEDED FOR ANXIETY 120 tablet 1  . lisinopril (ZESTRIL) 10 MG tablet TAKE 1 TABLET(10 MG) BY MOUTH DAILY 90 tablet 3  . mirtazapine (REMERON) 15 MG tablet TAKE 1 TABLET(15 MG) BY MOUTH AT BEDTIME 90 tablet 1  . nystatin cream (MYCOSTATIN) Apply 1 application topically 2 (two) times daily. 30 g 0  . simvastatin (ZOCOR) 20 MG tablet TAKE 1 TABLET(20 MG) BY MOUTH AT BEDTIME.  **DUE FOR YEARLY PHYSICAL** 30 tablet 0   No facility-administered medications  prior to visit.    Allergies  Allergen Reactions  . Codeine Phosphate Nausea And Vomiting    ROS Review of Systems  Constitutional: Negative for fatigue.  Eyes: Negative for visual disturbance.  Respiratory: Negative for cough, chest tightness and shortness of breath.   Cardiovascular: Negative for chest pain, palpitations and leg swelling.  Musculoskeletal: Negative for arthralgias.  Skin: Negative for rash.  Neurological: Negative for dizziness, syncope, weakness, light-headedness and headaches.      Objective:    Physical Exam Vitals reviewed.  Constitutional:      Appearance: Normal appearance.  Cardiovascular:     Rate and Rhythm: Normal rate and regular rhythm.  Pulmonary:     Effort: Pulmonary effort is normal.     Breath sounds: Normal breath sounds.  Skin:    Comments: Right wrist dorsally reveals approximately 1 cm diameter area of thickened excoriated skin.  No visible foreign bodies palpated or visualized with magnification.  No surrounding cellulitis changes.  Nonfluctuant.  Neurological:     Mental Status: He is alert.     BP (!) 168/90 (BP Location: Left Arm, Patient Position: Sitting, Cuff Size: Normal)   Pulse 66   Temp 98.2 F (36.8 C) (Oral)   Ht 5\' 9"  (1.753 m)   Wt 139 lb 2 oz (63.1 kg)   SpO2 97%   BMI 20.55 kg/m  Wt Readings from Last 3 Encounters:  03/11/20 139 lb 2 oz (63.1 kg)  02/12/20 139 lb (63 kg)  08/17/19 139 lb (63 kg)     There are no preventive care reminders to display for this patient.  There are no preventive care reminders to display for this patient.  Lab Results  Component Value Date   TSH 0.78 07/06/2018   Lab Results  Component Value Date   WBC 5.7 07/06/2018   HGB 14.8 07/06/2018   HCT 43.4 07/06/2018   MCV 92.6 07/06/2018   PLT 225.0 07/06/2018   Lab Results  Component Value Date   NA 140 07/06/2018   K 4.6 07/06/2018   CO2 27 07/06/2018   GLUCOSE 84 07/06/2018   BUN 14 07/06/2018   CREATININE  1.15 07/06/2018   BILITOT 0.5 07/06/2018   ALKPHOS 51 07/06/2018   AST 17 07/06/2018   ALT 15 07/06/2018   PROT 6.9 07/06/2018   ALBUMIN 4.7 07/06/2018   CALCIUM 9.5 07/06/2018   GFR 63.19 07/06/2018   Lab Results  Component Value Date   CHOL 179 07/06/2018   Lab Results  Component Value Date   HDL  47.40 07/06/2018   Lab Results  Component Value Date   LDLCALC 113 (H) 07/06/2018   Lab Results  Component Value Date   TRIG 95.0 07/06/2018   Lab Results  Component Value Date   CHOLHDL 4 07/06/2018   No results found for: HGBA1C    Assessment & Plan:   #1 thickened excoriated skin from recent tick bite.  Cannot rule out retained tick part but seems unlikely from history.  No signs of secondary infection currently.  -We have encouraged him to avoid scratching or squeezing this area.  Was given some Bactroban ointment to use topically -Could consider punch biopsy of this area if not resolving over the next several weeks if not improving with avoiding scratching  #2 hypertension elevated today.  Previously well controlled. -He is encouraged to monitor regularly at home and set up follow-up with primary in 2 weeks.  If still up at that point may need increase in medications  Meds ordered this encounter  Medications  . mupirocin ointment (BACTROBAN) 2 %    Sig: Apply 1 application topically 2 (two) times daily.    Dispense:  22 g    Refill:  0    Follow-up: Return in about 2 weeks (around 03/25/2020).    Carolann Littler, MD

## 2020-03-11 NOTE — Patient Instructions (Signed)
John Holland , Thank you for taking time to come for your Medicare Wellness Visit. I appreciate your ongoing commitment to your health goals. Please review the following plan we discussed and let me know if I can assist you in the future.   Screening recommendations/referrals: Colonoscopy: Up to date, next due 10/26/2021 Recommended yearly ophthalmology/optometry visit for glaucoma screening and checkup Recommended yearly dental visit for hygiene and checkup  Vaccinations: Influenza vaccine: Currently due, you may receive at your next office visit, or at your local pharmacy  Pneumococcal vaccine: Completed series Tdap vaccine: Currently due, you may receive at your local pharmacy. Shingles vaccine: Currently due, you may receive at your local pharmacy    Advanced directives: Advance directive discussed with you today. Even though you declined this today please call our office should you change your mind and we can give you the proper paperwork for you to fill out.   Conditions/risks identified: None  Next appointment: None   Preventive Care 65 Years and Older, Male Preventive care refers to lifestyle choices and visits with your health care provider that can promote health and wellness. What does preventive care include?  A yearly physical exam. This is also called an annual well check.  Dental exams once or twice a year.  Routine eye exams. Ask your health care provider how often you should have your eyes checked.  Personal lifestyle choices, including:  Daily care of your teeth and gums.  Regular physical activity.  Eating a healthy diet.  Avoiding tobacco and drug use.  Limiting alcohol use.  Practicing safe sex.  Taking low doses of aspirin every day.  Taking vitamin and mineral supplements as recommended by your health care provider. What happens during an annual well check? The services and screenings done by your health care provider during your annual well  check will depend on your age, overall health, lifestyle risk factors, and family history of disease. Counseling  Your health care provider may ask you questions about your:  Alcohol use.  Tobacco use.  Drug use.  Emotional well-being.  Home and relationship well-being.  Sexual activity.  Eating habits.  History of falls.  Memory and ability to understand (cognition).  Work and work Statistician. Screening  You may have the following tests or measurements:  Height, weight, and BMI.  Blood pressure.  Lipid and cholesterol levels. These may be checked every 5 years, or more frequently if you are over 51 years old.  Skin check.  Lung cancer screening. You may have this screening every year starting at age 41 if you have a 30-pack-year history of smoking and currently smoke or have quit within the past 15 years.  Fecal occult blood test (FOBT) of the stool. You may have this test every year starting at age 40.  Flexible sigmoidoscopy or colonoscopy. You may have a sigmoidoscopy every 5 years or a colonoscopy every 10 years starting at age 67.  Prostate cancer screening. Recommendations will vary depending on your family history and other risks.  Hepatitis C blood test.  Hepatitis B blood test.  Sexually transmitted disease (STD) testing.  Diabetes screening. This is done by checking your blood sugar (glucose) after you have not eaten for a while (fasting). You may have this done every 1-3 years.  Abdominal aortic aneurysm (AAA) screening. You may need this if you are a current or former smoker.  Osteoporosis. You may be screened starting at age 65 if you are at high risk. Talk with your health  care provider about your test results, treatment options, and if necessary, the need for more tests. Vaccines  Your health care provider may recommend certain vaccines, such as:  Influenza vaccine. This is recommended every year.  Tetanus, diphtheria, and acellular  pertussis (Tdap, Td) vaccine. You may need a Td booster every 10 years.  Zoster vaccine. You may need this after age 75.  Pneumococcal 13-valent conjugate (PCV13) vaccine. One dose is recommended after age 17.  Pneumococcal polysaccharide (PPSV23) vaccine. One dose is recommended after age 51. Talk to your health care provider about which screenings and vaccines you need and how often you need them. This information is not intended to replace advice given to you by your health care provider. Make sure you discuss any questions you have with your health care provider. Document Released: 06/20/2015 Document Revised: 02/11/2016 Document Reviewed: 03/25/2015 Elsevier Interactive Patient Education  2017 [place] Prevention in the Home Falls can cause injuries. They can happen to people of all ages. There are many things you can do to make your home safe and to help prevent falls. What can I do on the outside of my home?  Regularly fix the edges of walkways and driveways and fix any cracks.  Remove anything that might make you trip as you walk through a door, such as a raised step or threshold.  Trim any bushes or trees on the path to your home.  Use bright outdoor lighting.  Clear any walking paths of anything that might make someone trip, such as rocks or tools.  Regularly check to see if handrails are loose or broken. Make sure that both sides of any steps have handrails.  Any raised decks and porches should have guardrails on the edges.  Have any leaves, snow, or ice cleared regularly.  Use sand or salt on walking paths during winter.  Clean up any spills in your garage right away. This includes oil or grease spills. What can I do in the bathroom?  Use night lights.  Install grab bars by the toilet and in the tub and shower. Do not use towel bars as grab bars.  Use non-skid mats or decals in the tub or shower.  If you need to sit down in the shower, use a plastic,  non-slip stool.  Keep the floor dry. Clean up any water that spills on the floor as soon as it happens.  Remove soap buildup in the tub or shower regularly.  Attach bath mats securely with double-sided non-slip rug tape.  Do not have throw rugs and other things on the floor that can make you trip. What can I do in the bedroom?  Use night lights.  Make sure that you have a light by your bed that is easy to reach.  Do not use any sheets or blankets that are too big for your bed. They should not hang down onto the floor.  Have a firm chair that has side arms. You can use this for support while you get dressed.  Do not have throw rugs and other things on the floor that can make you trip. What can I do in the kitchen?  Clean up any spills right away.  Avoid walking on wet floors.  Keep items that you use a lot in easy-to-reach places.  If you need to reach something above you, use a strong step stool that has a grab bar.  Keep electrical cords out of the way.  Do not use floor  polish or wax that makes floors slippery. If you must use wax, use non-skid floor wax.  Do not have throw rugs and other things on the floor that can make you trip. What can I do with my stairs?  Do not leave any items on the stairs.  Make sure that there are handrails on both sides of the stairs and use them. Fix handrails that are broken or loose. Make sure that handrails are as long as the stairways.  Check any carpeting to make sure that it is firmly attached to the stairs. Fix any carpet that is loose or worn.  Avoid having throw rugs at the top or bottom of the stairs. If you do have throw rugs, attach them to the floor with carpet tape.  Make sure that you have a light switch at the top of the stairs and the bottom of the stairs. If you do not have them, ask someone to add them for you. What else can I do to help prevent falls?  Wear shoes that:  Do not have high heels.  Have rubber  bottoms.  Are comfortable and fit you well.  Are closed at the toe. Do not wear sandals.  If you use a stepladder:  Make sure that it is fully opened. Do not climb a closed stepladder.  Make sure that both sides of the stepladder are locked into place.  Ask someone to hold it for you, if possible.  Clearly mark and make sure that you can see:  Any grab bars or handrails.  First and last steps.  Where the edge of each step is.  Use tools that help you move around (mobility aids) if they are needed. These include:  Canes.  Walkers.  Scooters.  Crutches.  Turn on the lights when you go into a dark area. Replace any light bulbs as soon as they burn out.  Set up your furniture so you have a clear path. Avoid moving your furniture around.  If any of your floors are uneven, fix them.  If there are any pets around you, be aware of where they are.  Review your medicines with your doctor. Some medicines can make you feel dizzy. This can increase your chance of falling. Ask your doctor what other things that you can do to help prevent falls. This information is not intended to replace advice given to you by your health care provider. Make sure you discuss any questions you have with your health care provider. Document Released: 03/20/2009 Document Revised: 10/30/2015 Document Reviewed: 06/28/2014 Elsevier Interactive Patient Education  2017 [employer].

## 2020-03-12 DIAGNOSIS — M25512 Pain in left shoulder: Secondary | ICD-10-CM | POA: Diagnosis not present

## 2020-03-12 DIAGNOSIS — M7542 Impingement syndrome of left shoulder: Secondary | ICD-10-CM | POA: Diagnosis not present

## 2020-03-18 ENCOUNTER — Other Ambulatory Visit: Payer: Self-pay | Admitting: Adult Health

## 2020-03-25 ENCOUNTER — Other Ambulatory Visit: Payer: Self-pay | Admitting: Adult Health

## 2020-03-25 DIAGNOSIS — F418 Other specified anxiety disorders: Secondary | ICD-10-CM

## 2020-04-01 DIAGNOSIS — M7542 Impingement syndrome of left shoulder: Secondary | ICD-10-CM | POA: Diagnosis not present

## 2020-04-01 DIAGNOSIS — M75112 Incomplete rotator cuff tear or rupture of left shoulder, not specified as traumatic: Secondary | ICD-10-CM | POA: Diagnosis not present

## 2020-04-01 DIAGNOSIS — S43432A Superior glenoid labrum lesion of left shoulder, initial encounter: Secondary | ICD-10-CM | POA: Diagnosis not present

## 2020-04-01 DIAGNOSIS — G8918 Other acute postprocedural pain: Secondary | ICD-10-CM | POA: Diagnosis not present

## 2020-04-01 DIAGNOSIS — M24112 Other articular cartilage disorders, left shoulder: Secondary | ICD-10-CM | POA: Diagnosis not present

## 2020-04-01 DIAGNOSIS — M659 Synovitis and tenosynovitis, unspecified: Secondary | ICD-10-CM | POA: Diagnosis not present

## 2020-04-01 DIAGNOSIS — M19012 Primary osteoarthritis, left shoulder: Secondary | ICD-10-CM | POA: Diagnosis not present

## 2020-04-07 HISTORY — PX: ROTATOR CUFF REPAIR: SHX139

## 2020-04-09 DIAGNOSIS — M25512 Pain in left shoulder: Secondary | ICD-10-CM | POA: Diagnosis not present

## 2020-04-10 ENCOUNTER — Other Ambulatory Visit: Payer: Self-pay | Admitting: Adult Health

## 2020-04-10 DIAGNOSIS — E785 Hyperlipidemia, unspecified: Secondary | ICD-10-CM

## 2020-04-15 ENCOUNTER — Encounter: Payer: Medicare Other | Admitting: Adult Health

## 2020-04-15 DIAGNOSIS — M25512 Pain in left shoulder: Secondary | ICD-10-CM | POA: Diagnosis not present

## 2020-04-18 DIAGNOSIS — M25512 Pain in left shoulder: Secondary | ICD-10-CM | POA: Diagnosis not present

## 2020-04-21 DIAGNOSIS — M25512 Pain in left shoulder: Secondary | ICD-10-CM | POA: Diagnosis not present

## 2020-04-23 DIAGNOSIS — M25512 Pain in left shoulder: Secondary | ICD-10-CM | POA: Diagnosis not present

## 2020-05-05 DIAGNOSIS — M25512 Pain in left shoulder: Secondary | ICD-10-CM | POA: Diagnosis not present

## 2020-06-12 ENCOUNTER — Other Ambulatory Visit: Payer: Self-pay | Admitting: Adult Health

## 2020-06-12 DIAGNOSIS — F418 Other specified anxiety disorders: Secondary | ICD-10-CM

## 2020-07-07 ENCOUNTER — Other Ambulatory Visit: Payer: Self-pay | Admitting: Adult Health

## 2020-07-07 DIAGNOSIS — E785 Hyperlipidemia, unspecified: Secondary | ICD-10-CM

## 2020-07-07 DIAGNOSIS — F418 Other specified anxiety disorders: Secondary | ICD-10-CM

## 2020-07-08 NOTE — Telephone Encounter (Signed)
Denied.  Pt is past due for cpx. 

## 2020-08-14 ENCOUNTER — Other Ambulatory Visit: Payer: Self-pay

## 2020-08-15 ENCOUNTER — Encounter: Payer: Self-pay | Admitting: Adult Health

## 2020-08-15 ENCOUNTER — Ambulatory Visit (INDEPENDENT_AMBULATORY_CARE_PROVIDER_SITE_OTHER): Payer: Medicare Other | Admitting: Adult Health

## 2020-08-15 VITALS — BP 140/80 | HR 83 | Temp 98.0°F | Ht 69.0 in | Wt 145.2 lb

## 2020-08-15 DIAGNOSIS — E782 Mixed hyperlipidemia: Secondary | ICD-10-CM

## 2020-08-15 DIAGNOSIS — N400 Enlarged prostate without lower urinary tract symptoms: Secondary | ICD-10-CM | POA: Diagnosis not present

## 2020-08-15 DIAGNOSIS — F418 Other specified anxiety disorders: Secondary | ICD-10-CM | POA: Diagnosis not present

## 2020-08-15 DIAGNOSIS — I10 Essential (primary) hypertension: Secondary | ICD-10-CM

## 2020-08-15 LAB — CBC WITH DIFFERENTIAL/PLATELET
Basophils Absolute: 0.1 10*3/uL (ref 0.0–0.1)
Basophils Relative: 1.1 % (ref 0.0–3.0)
Eosinophils Absolute: 0.1 10*3/uL (ref 0.0–0.7)
Eosinophils Relative: 1.7 % (ref 0.0–5.0)
HCT: 45.3 % (ref 39.0–52.0)
Hemoglobin: 15.7 g/dL (ref 13.0–17.0)
Lymphocytes Relative: 29.5 % (ref 12.0–46.0)
Lymphs Abs: 2 10*3/uL (ref 0.7–4.0)
MCHC: 34.6 g/dL (ref 30.0–36.0)
MCV: 93.9 fl (ref 78.0–100.0)
Monocytes Absolute: 0.6 10*3/uL (ref 0.1–1.0)
Monocytes Relative: 9 % (ref 3.0–12.0)
Neutro Abs: 3.9 10*3/uL (ref 1.4–7.7)
Neutrophils Relative %: 58.7 % (ref 43.0–77.0)
Platelets: 244 10*3/uL (ref 150.0–400.0)
RBC: 4.82 Mil/uL (ref 4.22–5.81)
RDW: 12.9 % (ref 11.5–15.5)
WBC: 6.6 10*3/uL (ref 4.0–10.5)

## 2020-08-15 LAB — LIPID PANEL
Cholesterol: 229 mg/dL — ABNORMAL HIGH (ref 0–200)
HDL: 39.7 mg/dL (ref >39.00)
LDL Cholesterol: 151 mg/dL — ABNORMAL HIGH (ref 0–99)
NonHDL: 189.21
Total CHOL/HDL Ratio: 6
Triglycerides: 193 mg/dL — ABNORMAL HIGH (ref 0.0–149.0)
VLDL: 38.6 mg/dL (ref 0.0–40.0)

## 2020-08-15 LAB — COMPREHENSIVE METABOLIC PANEL
ALT: 18 U/L (ref 0–53)
AST: 19 U/L (ref 0–37)
Albumin: 4.6 g/dL (ref 3.5–5.2)
Alkaline Phosphatase: 57 U/L (ref 39–117)
BUN: 14 mg/dL (ref 6–23)
CO2: 26 mEq/L (ref 19–32)
Calcium: 9.6 mg/dL (ref 8.4–10.5)
Chloride: 103 mEq/L (ref 96–112)
Creatinine, Ser: 1.21 mg/dL (ref 0.40–1.50)
GFR: 60.73 mL/min (ref >60.00)
Glucose, Bld: 92 mg/dL (ref 70–99)
Potassium: 4.8 mEq/L (ref 3.5–5.1)
Sodium: 138 mEq/L (ref 135–145)
Total Bilirubin: 0.5 mg/dL (ref 0.2–1.2)
Total Protein: 7.5 g/dL (ref 6.0–8.3)

## 2020-08-15 LAB — PSA: PSA: 2.22 ng/mL (ref 0.10–4.00)

## 2020-08-15 LAB — TSH: TSH: 0.95 u[IU]/mL (ref 0.35–4.50)

## 2020-08-15 MED ORDER — LISINOPRIL 10 MG PO TABS: ORAL_TABLET | ORAL | 3 refills | Status: DC

## 2020-08-15 MED ORDER — SIMVASTATIN 20 MG PO TABS: ORAL_TABLET | ORAL | 3 refills | Status: DC

## 2020-08-15 MED ORDER — NYSTATIN 100000 UNIT/GM EX CREA: 1.0000 "application " | TOPICAL_CREAM | Freq: Two times a day (BID) | CUTANEOUS | 3 refills | Status: DC

## 2020-08-15 NOTE — Progress Notes (Signed)
Subjective:    Patient ID: John Holland, male    DOB: 06-Mar-1951, 70 y.o.   MRN: [id number]  HPI Patient presents for yearly preventative medicine examination. He is a pleasant 70 year old male who  has a past medical history of Anxiety, Arthritis, Bipolar 1 disorder ([place]), Depression, Hepatitis C (12/05/2015), and Hyperlipidemia.  Hyperlipidemia-takes simvastatin 20 mg daily.  Denies myalgia or fatigue  Lab Results  Component Value Date   CHOL 179 07/06/2018   HDL 47.40 07/06/2018   LDLCALC 113 (H) 07/06/2018   TRIG 95.0 07/06/2018   CHOLHDL 4 07/06/2018   Essential hypertension-takes lisinopril 10 mg daily.  He denies dizziness, lightheadedness, chest pain, shortness of breath  BP Readings from Last 3 Encounters:  08/15/20 140/80  03/11/20 (!) 168/90  02/12/20 118/80   Anxiety/Depression- Currently prescribed Valium 5 mg TID. Was also prescribed Remeron but has been experiencing night sweats and vivid dreams. He did not notice that this medication helped with his symptoms. He would like to taper himself off this medication and see how his symptoms are once he is off it   BPH - symptoms absent   All immunizations and health maintenance protocols were reviewed with the patient and needed orders were placed.  Appropriate screening laboratory values were ordered for the patient including screening of hyperlipidemia, renal function and hepatic function. If indicated by BPH, a PSA was ordered.  Medication reconciliation,  past medical history, social history, problem list and allergies were reviewed in detail with the patient  Goals were established with regard to weight loss, exercise, and  diet in compliance with medications  Wt Readings from Last 3 Encounters:  08/15/20 145 lb 3.2 oz (65.9 kg)  03/11/20 139 lb 2 oz (63.1 kg)  02/12/20 139 lb (63 kg)   Review of Systems  Constitutional: Negative.   HENT: Negative.   Eyes: Negative.   Respiratory: Negative.    Cardiovascular: Negative.   Gastrointestinal: Negative.   Endocrine: Negative.   Genitourinary: Negative.   Musculoskeletal: Positive for arthralgias.  Skin: Negative.   Allergic/Immunologic: Negative.   Neurological: Negative.   Hematological: Negative.   Psychiatric/Behavioral: Negative.   All other systems reviewed and are negative.  Past Medical History:  Diagnosis Date  . Anxiety   . Arthritis   . Bipolar 1 disorder ([place])   . Depression   . Hepatitis C 12/05/2015  . Hyperlipidemia     Social History   Socioeconomic History  . Marital status: Married    Spouse name: Not on file  . Number of children: Not on file  . Years of education: Not on file  . Highest education level: Not on file  Occupational History  . Occupation: Psychologist, occupational: [employer]  Tobacco Use  . Smoking status: Former Smoker    Types: Cigarettes    Quit date: 03/02/2011    Years since quitting: 9.4  . Smokeless tobacco: Never Used  Substance and Sexual Activity  . Alcohol use: No  . Drug use: No  . Sexual activity: Not on file  Other Topics Concern  . Not on file  Social History Narrative   Works in a [place]: [place]   . Difficulty of Paying Living Expenses: Not hard at all  Food Insecurity: No Food Insecurity  . Worried About Charity fundraiser in the Last Year: Never true  .  Ran Out of Food in the Last Year: Never true  Transportation Needs: No Transportation Needs  . Lack of Transportation (Medical): No  . Lack of Transportation (Non-Medical): No  Physical Activity: Inactive  . Days of Exercise per Week: 0 days  . Minutes of Exercise per Session: 0 min  Stress: No Stress Concern Present  . Feeling of Stress : Not at all  Social Connections: Moderately Isolated  . Frequency of Communication with Friends and Family: More than three times a week  . Frequency of Social Gatherings  with Friends and Family: Twice a week  . Attends Religious Services: More than 4 times per year  . Active Member of Clubs or Organizations: No  . Attends Archivist Meetings: Never  . Marital Status: Widowed  Intimate Partner Violence: Not At Risk  . Fear of Current or Ex-Partner: No  . Emotionally Abused: No  . Physically Abused: No  . Sexually Abused: No    Past Surgical History:  Procedure Laterality Date  . ANKLE FRACTURE SURGERY     age 2-lt  . arthroscopy arm  2003   right elbow bone frag  . COLONOSCOPY    . ROTATOR CUFF REPAIR Right 2015?  Marland Kitchen ROTATOR CUFF REPAIR Left 04/2020    Family History  Problem Relation Age of Onset  . COPD Mother   . Heart disease Mother   . Colon cancer Neg Hx   . Stomach cancer Neg Hx     Allergies  Allergen Reactions  . Codeine Phosphate Nausea And Vomiting    Current Outpatient Medications on File Prior to Visit  Medication Sig Dispense Refill  . diazepam (VALIUM) 5 MG tablet TAKE 1 TABLET(5 MG) BY MOUTH EVERY 6 HOURS AS NEEDED FOR ANXIETY 120 tablet 2  . lisinopril (ZESTRIL) 10 MG tablet TAKE 1 TABLET(10 MG) BY MOUTH DAILY 90 tablet 3  . mupirocin ointment (BACTROBAN) 2 % Apply 1 application topically 2 (two) times daily. 22 g 0  . naproxen (NAPROSYN) 500 MG tablet naproxen 500 mg tablet  TAKE 1 TABLET BY MOUTH TWICE DAILY    . nystatin cream (MYCOSTATIN) Apply 1 application topically 2 (two) times daily. 30 g 0  . simvastatin (ZOCOR) 20 MG tablet TAKE 1 TABLET(20 MG) BY MOUTH AT BEDTIME 90 tablet 0   No current facility-administered medications on file prior to visit.    BP 140/80   Pulse 83   Temp 98 F (36.7 C)   Ht 5\' 9"  (1.753 m)   Wt 145 lb 3.2 oz (65.9 kg)   SpO2 97%   BMI 21.44 kg/m       Objective:   Physical Exam Vitals and nursing note reviewed.  Constitutional:      General: He is not in acute distress.    Appearance: Normal appearance. He is well-developed and normal weight.  HENT:     Head:  Normocephalic and atraumatic.     Right Ear: Tympanic membrane, ear canal and external ear normal. There is no impacted cerumen.     Left Ear: Tympanic membrane, ear canal and external ear normal. There is no impacted cerumen.     Nose: Nose normal. No congestion or rhinorrhea.     Mouth/Throat:     Mouth: Mucous membranes are moist.     Pharynx: Oropharynx is clear. No oropharyngeal exudate or posterior oropharyngeal erythema.  Eyes:     General:        Right eye: No discharge.  Left eye: No discharge.     Extraocular Movements: Extraocular movements intact.     Conjunctiva/sclera: Conjunctivae normal.     Pupils: Pupils are equal, round, and reactive to light.  Neck:     Vascular: No carotid bruit.     Trachea: No tracheal deviation.  Cardiovascular:     Rate and Rhythm: Normal rate and regular rhythm.     Pulses: Normal pulses.     Heart sounds: Normal heart sounds. No murmur heard. No friction rub. No gallop.   Pulmonary:     Effort: Pulmonary effort is normal. No respiratory distress.     Breath sounds: Normal breath sounds. No stridor. No wheezing, rhonchi or rales.  Chest:     Chest wall: No tenderness.  Abdominal:     General: Bowel sounds are normal. There is no distension.     Palpations: Abdomen is soft. There is no mass.     Tenderness: There is no abdominal tenderness. There is no right CVA tenderness, left CVA tenderness, guarding or rebound.     Hernia: No hernia is present.  Musculoskeletal:        General: Tenderness (left shoulder ) present. No swelling, deformity or signs of injury. Normal range of motion.     Right lower leg: No edema.     Left lower leg: No edema.  Lymphadenopathy:     Cervical: No cervical adenopathy.  Skin:    General: Skin is warm and dry.     Capillary Refill: Capillary refill takes less than 2 seconds.     Coloration: Skin is not jaundiced or pale.     Findings: No bruising, erythema, lesion or rash.  Neurological:      General: No focal deficit present.     Mental Status: He is alert and oriented to person, place, and time.     Cranial Nerves: No cranial nerve deficit.     Sensory: No sensory deficit.     Motor: No weakness.     Coordination: Coordination normal.     Gait: Gait normal.     Deep Tendon Reflexes: Reflexes normal.  Psychiatric:        Mood and Affect: Mood normal.        Behavior: Behavior normal.        Thought Content: Thought content normal.        Judgment: Judgment normal.       Assessment & Plan:  1. Essential hypertension - No change in medication - Follow up in one year or sooner if needed - lisinopril (ZESTRIL) 10 MG tablet; TAKE 1 TABLET(10 MG) BY MOUTH DAILY  Dispense: 90 tablet; Refill: 3 - CBC with Differential/Platelet; Future - Comprehensive metabolic panel; Future - Lipid panel; Future - TSH; Future - CBC with Differential/Platelet - Comprehensive metabolic panel - TSH - Lipid panel  2. Depression with anxiety - Continue with Valium  - Taper instructions given for Remeron  - Follow up as needed  3. Mixed hyperlipidemia - Continue with Simvastatin  - simvastatin (ZOCOR) 20 MG tablet; TAKE 1 TABLET(20 MG) BY MOUTH AT BEDTIME  Dispense: 90 tablet; Refill: 3 - CBC with Differential/Platelet; Future - Comprehensive metabolic panel; Future - Lipid panel; Future - TSH; Future - CBC with Differential/Platelet - Comprehensive metabolic panel - TSH - Lipid panel  4. Benign prostatic hyperplasia without lower urinary tract symptoms  - PSA; Future - PSA   Dorothyann Peng, NP

## 2020-09-27 DIAGNOSIS — Z23 Encounter for immunization: Secondary | ICD-10-CM | POA: Diagnosis not present

## 2020-12-09 ENCOUNTER — Other Ambulatory Visit: Payer: Self-pay | Admitting: Adult Health

## 2020-12-09 DIAGNOSIS — F418 Other specified anxiety disorders: Secondary | ICD-10-CM

## 2020-12-09 NOTE — Telephone Encounter (Signed)
Okay for refill?    LOV 08/15/20   Last Refill    06/12/2020     120 QTY.  2  Refills

## 2020-12-25 ENCOUNTER — Other Ambulatory Visit: Payer: Self-pay | Admitting: Adult Health

## 2020-12-25 DIAGNOSIS — F418 Other specified anxiety disorders: Secondary | ICD-10-CM

## 2020-12-26 ENCOUNTER — Other Ambulatory Visit: Payer: Self-pay | Admitting: Adult Health

## 2020-12-26 DIAGNOSIS — F418 Other specified anxiety disorders: Secondary | ICD-10-CM

## 2020-12-29 ENCOUNTER — Telehealth: Payer: Self-pay | Admitting: Adult Health

## 2020-12-29 NOTE — Telephone Encounter (Signed)
Patient calling to check on refill request for Mirtazapine 15 mg. Patient stated the request was denied but not sure why.  Patient last CPE was 08/15/2020  Please advise. Call back# [phone]

## 2020-12-30 ENCOUNTER — Other Ambulatory Visit: Payer: Self-pay | Admitting: Adult Health

## 2020-12-30 MED ORDER — MIRTAZAPINE 7.5 MG PO TABS: 7.5000 mg | ORAL_TABLET | Freq: Every day | ORAL | 1 refills | Status: DC

## 2020-12-30 NOTE — Telephone Encounter (Signed)
Pt stated that he was told to break the medications in half to help with that issue. Pt stated that the half tablet will work and if this could be called in. Pt stated he is going through withdrawal and needs the medication.

## 2020-12-30 NOTE — Telephone Encounter (Signed)
Okay for refill? Pt had it in the past but other Rx stated that it was refused Please advise

## 2020-12-31 NOTE — Telephone Encounter (Signed)
This has been taking care of.

## 2021-01-13 NOTE — Telephone Encounter (Signed)
PT called in regards to their mirtazapine (REMERON) 7.5 MG tablet. PT states that he was taking 15 MG before and it was only costing him $20 and now that he is taking the 7.5 MG it now costs him $135. PT would like to go back to the 15 MG as it was easier just to cut it in half and way cheaper.

## 2021-01-14 MED ORDER — MIRTAZAPINE 15 MG PO TABS: 15.0000 mg | ORAL_TABLET | Freq: Every day | ORAL | 0 refills | Status: DC

## 2021-01-14 NOTE — Telephone Encounter (Signed)
Rx has been sent in for '15mg'$ 

## 2021-03-12 ENCOUNTER — Ambulatory Visit: Payer: Medicare Other

## 2021-03-25 ENCOUNTER — Ambulatory Visit (INDEPENDENT_AMBULATORY_CARE_PROVIDER_SITE_OTHER): Payer: Medicare Other

## 2021-03-25 DIAGNOSIS — Z0000 Encounter for general adult medical examination without abnormal findings: Secondary | ICD-10-CM

## 2021-03-25 DIAGNOSIS — Z23 Encounter for immunization: Secondary | ICD-10-CM | POA: Diagnosis not present

## 2021-03-25 NOTE — Patient Instructions (Signed)
[name] , Thank you for taking time to come for your Medicare Wellness Visit. I appreciate your ongoing commitment to your health goals. Please review the following plan we discussed and let me know if I can assist you in the future.   Screening recommendations/referrals: Colonoscopy: 10/27/2011  due 2023 Recommended yearly ophthalmology/optometry visit for glaucoma screening and checkup Recommended yearly dental visit for hygiene and checkup  Vaccinations: Influenza vaccine: due in fall 2022  Pneumococcal vaccine: Completed series  Tdap vaccine: due with injury  Shingles vaccine: will consider     Advanced directives: none   Conditions/risks identified: none   Next appointment: CPE 04/22/2021  930am Dorothyann Peng   Preventive Care 8 Years and Older, Male Preventive care refers to lifestyle choices and visits with your health care provider that can promote health and wellness. What does preventive care include? A yearly physical exam. This is also called an annual well check. Dental exams once or twice a year. Routine eye exams. Ask your health care provider how often you should have your eyes checked. Personal lifestyle choices, including: Daily care of your teeth and gums. Regular physical activity. Eating a healthy diet. Avoiding tobacco and drug use. Limiting alcohol use. Practicing safe sex. Taking low doses of aspirin every day. Taking vitamin and mineral supplements as recommended by your health care provider. What happens during an annual well check? The services and screenings done by your health care provider during your annual well check will depend on your age, overall health, lifestyle risk factors, and family history of disease. Counseling  Your health care provider may ask you questions about your: Alcohol use. Tobacco use. Drug use. Emotional well-being. Home and relationship well-being. Sexual activity. Eating habits. History of falls. Memory and  ability to understand (cognition). Work and work Statistician. Screening  You may have the following tests or measurements: Height, weight, and BMI. Blood pressure. Lipid and cholesterol levels. These may be checked every 5 years, or more frequently if you are over 50 years old. Skin check. Lung cancer screening. You may have this screening every year starting at age 92 if you have a 30-pack-year history of smoking and currently smoke or have quit within the past 15 years. Fecal occult blood test (FOBT) of the stool. You may have this test every year starting at age 80. Flexible sigmoidoscopy or colonoscopy. You may have a sigmoidoscopy every 5 years or a colonoscopy every 10 years starting at age 88. Prostate cancer screening. Recommendations will vary depending on your family history and other risks. Hepatitis C blood test. Hepatitis B blood test. Sexually transmitted disease (STD) testing. Diabetes screening. This is done by checking your blood sugar (glucose) after you have not eaten for a while (fasting). You may have this done every 1-3 years. Abdominal aortic aneurysm (AAA) screening. You may need this if you are a current or former smoker. Osteoporosis. You may be screened starting at age 78 if you are at high risk. Talk with your health care provider about your test results, treatment options, and if necessary, the need for more tests. Vaccines  Your health care provider may recommend certain vaccines, such as: Influenza vaccine. This is recommended every year. Tetanus, diphtheria, and acellular pertussis (Tdap, Td) vaccine. You may need a Td booster every 10 years. Zoster vaccine. You may need this after age 45. Pneumococcal 13-valent conjugate (PCV13) vaccine. One dose is recommended after age 44. Pneumococcal polysaccharide (PPSV23) vaccine. One dose is recommended after age 64. Talk to  your health care provider about which screenings and vaccines you need and how often you need  them. This information is not intended to replace advice given to you by your health care provider. Make sure you discuss any questions you have with your health care provider. Document Released: 06/20/2015 Document Revised: 02/11/2016 Document Reviewed: 03/25/2015 Elsevier Interactive Patient Education  2017 [place] Prevention in the Home Falls can cause injuries. They can happen to people of all ages. There are many things you can do to make your home safe and to help prevent falls. What can I do on the outside of my home? Regularly fix the edges of walkways and driveways and fix any cracks. Remove anything that might make you trip as you walk through a door, such as a raised step or threshold. Trim any bushes or trees on the path to your home. Use bright outdoor lighting. Clear any walking paths of anything that might make someone trip, such as rocks or tools. Regularly check to see if handrails are loose or broken. Make sure that both sides of any steps have handrails. Any raised decks and porches should have guardrails on the edges. Have any leaves, snow, or ice cleared regularly. Use sand or salt on walking paths during winter. Clean up any spills in your garage right away. This includes oil or grease spills. What can I do in the bathroom? Use night lights. Install grab bars by the toilet and in the tub and shower. Do not use towel bars as grab bars. Use non-skid mats or decals in the tub or shower. If you need to sit down in the shower, use a plastic, non-slip stool. Keep the floor dry. Clean up any water that spills on the floor as soon as it happens. Remove soap buildup in the tub or shower regularly. Attach bath mats securely with double-sided non-slip rug tape. Do not have throw rugs and other things on the floor that can make you trip. What can I do in the bedroom? Use night lights. Make sure that you have a light by your bed that is easy to reach. Do not use  any sheets or blankets that are too big for your bed. They should not hang down onto the floor. Have a firm chair that has side arms. You can use this for support while you get dressed. Do not have throw rugs and other things on the floor that can make you trip. What can I do in the kitchen? Clean up any spills right away. Avoid walking on wet floors. Keep items that you use a lot in easy-to-reach places. If you need to reach something above you, use a strong step stool that has a grab bar. Keep electrical cords out of the way. Do not use floor polish or wax that makes floors slippery. If you must use wax, use non-skid floor wax. Do not have throw rugs and other things on the floor that can make you trip. What can I do with my stairs? Do not leave any items on the stairs. Make sure that there are handrails on both sides of the stairs and use them. Fix handrails that are broken or loose. Make sure that handrails are as long as the stairways. Check any carpeting to make sure that it is firmly attached to the stairs. Fix any carpet that is loose or worn. Avoid having throw rugs at the top or bottom of the stairs. If you do have throw rugs, attach  them to the floor with carpet tape. Make sure that you have a light switch at the top of the stairs and the bottom of the stairs. If you do not have them, ask someone to add them for you. What else can I do to help prevent falls? Wear shoes that: Do not have high heels. Have rubber bottoms. Are comfortable and fit you well. Are closed at the toe. Do not wear sandals. If you use a stepladder: Make sure that it is fully opened. Do not climb a closed stepladder. Make sure that both sides of the stepladder are locked into place. Ask someone to hold it for you, if possible. Clearly mark and make sure that you can see: Any grab bars or handrails. First and last steps. Where the edge of each step is. Use tools that help you move around (mobility aids)  if they are needed. These include: Canes. Walkers. Scooters. Crutches. Turn on the lights when you go into a dark area. Replace any light bulbs as soon as they burn out. Set up your furniture so you have a clear path. Avoid moving your furniture around. If any of your floors are uneven, fix them. If there are any pets around you, be aware of where they are. Review your medicines with your doctor. Some medicines can make you feel dizzy. This can increase your chance of falling. Ask your doctor what other things that you can do to help prevent falls. This information is not intended to replace advice given to you by your health care provider. Make sure you discuss any questions you have with your health care provider. Document Released: 03/20/2009 Document Revised: 10/30/2015 Document Reviewed: 06/28/2014 Elsevier Interactive Patient Education  2017 [employer].

## 2021-03-25 NOTE — Progress Notes (Signed)
Subjective:   John Holland is a 70 y.o. male who presents for an Subsequent Medicare Annual Wellness Visit.   I connected with John Holland today by telephone and verified that I am speaking with the correct person using two identifiers. Location patient: home Location provider: work Persons participating in the virtual visit: patient, provider.   I discussed the limitations, risks, security and privacy concerns of performing an evaluation and management service by telephone and the availability of in person appointments. I also discussed with the patient that there may be a patient responsible charge related to this service. The patient expressed understanding and verbally consented to this telephonic visit.    Interactive audio and video telecommunications were attempted between this provider and patient, however failed, due to patient having technical difficulties OR patient did not have access to video capability.  We continued and completed visit with audio only.    Review of Systems           Objective:    There were no vitals filed for this visit. There is no height or weight on file to calculate BMI.  Advanced Directives 03/11/2020 11/05/2011  Does Patient Have a Medical Advance Directive? No Patient does not have advance directive  Would patient like information on creating a medical advance directive? No - Patient declined -    Current Medications (verified) Outpatient Encounter Medications as of 03/25/2021  Medication Sig   diazepam (VALIUM) 5 MG tablet TAKE 1 TABLET(5 MG) BY MOUTH EVERY 6 HOURS AS NEEDED FOR ANXIETY   lisinopril (ZESTRIL) 10 MG tablet TAKE 1 TABLET(10 MG) BY MOUTH DAILY   mirtazapine (REMERON) 15 MG tablet Take 1 tablet (15 mg total) by mouth at bedtime.   mupirocin ointment (BACTROBAN) 2 % Apply 1 application topically 2 (two) times daily.   naproxen (NAPROSYN) 500 MG tablet naproxen 500 mg tablet  TAKE 1 TABLET BY MOUTH TWICE DAILY   nystatin  cream (MYCOSTATIN) Apply 1 application topically 2 (two) times daily.   simvastatin (ZOCOR) 20 MG tablet TAKE 1 TABLET(20 MG) BY MOUTH AT BEDTIME   No facility-administered encounter medications on file as of 03/25/2021.    Allergies (verified) Codeine phosphate   History: Past Medical History:  Diagnosis Date   Anxiety    Arthritis    Bipolar 1 disorder ([place])    Depression    Hepatitis C 12/05/2015   Hyperlipidemia    Past Surgical History:  Procedure Laterality Date   ANKLE FRACTURE SURGERY     age 41-lt   arthroscopy arm  2003   right elbow bone frag   COLONOSCOPY     ROTATOR CUFF REPAIR Right 2015?   ROTATOR CUFF REPAIR Left 04/2020   Family History  Problem Relation Age of Onset   COPD Mother    Heart disease Mother    Colon cancer Neg Hx    Stomach cancer Neg Hx    Social History   Socioeconomic History   Marital status: Married    Spouse name: Not on file   Number of children: Not on file   Years of education: Not on file   Highest education level: Not on file  Occupational History   Occupation: Material Therapist, sports: [employer]  Tobacco Use   Smoking status: Former    Types: Cigarettes    Quit date: 03/02/2011    Years since quitting: 10.0   Smokeless tobacco: Never  Substance and Sexual Activity   Alcohol use:  No   Drug use: No   Sexual activity: Not on file  Other Topics Concern   Not on file  Social History Narrative   Works in a [employer] Strain: Not on file  Food Insecurity: Not on file  Transportation Needs: Not on file  Physical Activity: Not on file  Stress: Not on file  Social Connections: Not on file    Tobacco Counseling Counseling given: Not Answered   Clinical Intake:                 Diabetic?no         Activities of Daily Living No flowsheet data found.  Patient Care Team: Dorothyann Peng, NP as PCP - General (Family  Medicine)  Indicate any recent Medical Services you may have received from other than [facility] providers in the past year (date may be approximate).     Assessment:   This is a routine wellness examination for John Holland.  Hearing/Vision screen No results found.  Dietary issues and exercise activities discussed:     Goals Addressed   None    Depression Screen PHQ 2/9 Scores 08/15/2020 03/11/2020 03/23/2019 07/06/2018 09/21/2016 03/23/2016 12/17/2015  PHQ - 2 Score 2 0 1 0 0 0 0  PHQ- 9 Score 4 0 - - - - -    Fall Risk Fall Risk  03/11/2020 07/06/2018 04/25/2018 01/06/2018 09/21/2016  Falls in the past year? 0 0 0 No No  Comment - - Emmi Telephone Survey: data to providers prior to load [employer] Telephone Survey: data to providers prior to load -  Number falls in past yr: 0 - - - -  Injury with Fall? 0 - - - -  Risk for fall due to : No Fall Risks - - - -  Follow up Falls evaluation completed;Falls prevention discussed - - - -    FALL RISK PREVENTION PERTAINING TO THE HOME:  Any stairs in or around the home? Yes  If so, are there any without handrails? No  Home free of loose throw rugs in walkways, pet beds, electrical cords, etc? Yes  Adequate lighting in your home to reduce risk of falls? Yes   ASSISTIVE DEVICES UTILIZED TO PREVENT FALLS:  Life alert? No  Use of a cane, walker or w/c? No  Grab bars in the bathroom? No  Shower chair or bench in shower? No  Elevated toilet seat or a handicapped toilet? No    Cognitive Function:  Normal cognitive status assessed by direct observation by this Nurse Health Advisor. No abnormalities found.        Immunizations Immunization History  Administered Date(s) Administered   Fluad Quad(high Dose 65+) 02/22/2019, 03/11/2020   Influenza, High Dose Seasonal PF 04/08/2017   Influenza,inj,Quad PF,6+ Mos 03/23/2016   Influenza-Unspecified 01/05/2018   PFIZER(Purple Top)SARS-COV-2 Vaccination 06/28/2019, 07/19/2019   Pneumococcal Conjugate-13  10/17/2015   Pneumococcal Polysaccharide-23 12/07/2016   Td 06/07/2006    TDAP status: Due, Education has been provided regarding the importance of this vaccine. Advised may receive this vaccine at local pharmacy or Health Dept. Aware to provide a copy of the vaccination record if obtained from local pharmacy or Health Dept. Verbalized acceptance and understanding.  Flu Vaccine status: Up to date  Pneumococcal vaccine status: Up to date  Covid-19 vaccine status: Completed vaccines  Qualifies for Shingles Vaccine? Yes   Zostavax completed No   Shingrix Completed?: No.  Education has been provided regarding the importance of this vaccine. Patient has been advised to call insurance company to determine out of pocket expense if they have not yet received this vaccine. Advised may also receive vaccine at local pharmacy or Health Dept. Verbalized acceptance and understanding.  Screening Tests Health Maintenance  Topic Date Due   Zoster Vaccines- Shingrix (1 of 2) Never done   COVID-19 Vaccine (3 - Booster for Pfizer series) 12/16/2019   INFLUENZA VACCINE  01/05/2021   COLONOSCOPY (Pts 45-36yrs Insurance coverage will need to be confirmed)  10/26/2021   Pneumonia Vaccine 48+ Years old  Completed   Hepatitis C Screening  Completed   HPV VACCINES  Aged Out    Health Maintenance  Health Maintenance Due  Topic Date Due   Zoster Vaccines- Shingrix (1 of 2) Never done   COVID-19 Vaccine (3 - Booster for Pfizer series) 12/16/2019   INFLUENZA VACCINE  01/05/2021    Colorectal cancer screening: Type of screening: Colonoscopy. Completed 10/27/2011. Repeat every 10 years  Lung Cancer Screening: (Low Dose CT Chest recommended if Age 42-80 years, 30 pack-year currently smoking OR have quit w/in 15years.) does not qualify.   Lung Cancer Screening Referral: n/a  Additional Screening:  Hepatitis C Screening: does not qualify; Completed n/a  Vision Screening: Recommended annual  ophthalmology exams for early detection of glaucoma and other disorders of the eye. Is the patient up to date with their annual eye exam?  Yes  Who is the provider or what is the name of the office in which the patient attends annual eye exams? [facility]  If pt is not established with a provider, would they like to be referred to a provider to establish care? No .   Dental Screening: Recommended annual dental exams for proper oral hygiene  Community Resource Referral / Chronic Care Management: CRR required this visit?  No   CCM required this visit?  No      Plan:     I have personally reviewed and noted the following in the patient's chart:   Medical and social history Use of alcohol, tobacco or illicit drugs  Current medications and supplements including opioid prescriptions. Patient is not currently taking opioid prescriptions. Functional ability and status Nutritional status Physical activity Advanced directives List of other physicians Hospitalizations, surgeries, and ER visits in previous 12 months Vitals Screenings to include cognitive, depression, and falls Referrals and appointments  In addition, I have reviewed and discussed with patient certain preventive protocols, quality metrics, and best practice recommendations. A written personalized care plan for preventive services as well as general preventive health recommendations were provided to patient.     Randel Pigg, LPN   [id number]   Nurse Notes: none

## 2021-03-30 ENCOUNTER — Telehealth: Payer: Self-pay | Admitting: Adult Health

## 2021-03-30 NOTE — Telephone Encounter (Signed)
FYI Pt is calling and would like to stay with generic remeron 15 mg so he can cut in half. The generic remeron 7.5 mg is too expensive. Pt does not need refill at this. Pt has enough medications for 2 month supply

## 2021-03-31 NOTE — Telephone Encounter (Signed)
Noted. FYI 

## 2021-04-12 ENCOUNTER — Other Ambulatory Visit: Payer: Self-pay | Admitting: Adult Health

## 2021-04-22 ENCOUNTER — Encounter: Payer: Medicare Other | Admitting: Adult Health

## 2021-05-12 ENCOUNTER — Other Ambulatory Visit: Payer: Self-pay | Admitting: Adult Health

## 2021-05-12 DIAGNOSIS — F418 Other specified anxiety disorders: Secondary | ICD-10-CM

## 2021-05-13 NOTE — Telephone Encounter (Signed)
Okay for refill?  

## 2021-07-18 ENCOUNTER — Other Ambulatory Visit: Payer: Self-pay | Admitting: Adult Health

## 2021-07-20 NOTE — Telephone Encounter (Signed)
Rx filled for 30 days. Patient need to schedule an ov for more refills.

## 2021-08-06 ENCOUNTER — Other Ambulatory Visit: Payer: Self-pay | Admitting: Adult Health

## 2021-08-06 DIAGNOSIS — E782 Mixed hyperlipidemia: Secondary | ICD-10-CM

## 2021-08-06 DIAGNOSIS — I10 Essential (primary) hypertension: Secondary | ICD-10-CM

## 2021-08-06 NOTE — Telephone Encounter (Signed)
Patient need to schedule an ov for more refills. 

## 2021-08-16 ENCOUNTER — Other Ambulatory Visit: Payer: Self-pay | Admitting: Adult Health

## 2021-08-25 ENCOUNTER — Encounter (INDEPENDENT_AMBULATORY_CARE_PROVIDER_SITE_OTHER): Payer: Self-pay

## 2021-08-25 DIAGNOSIS — H40013 Open angle with borderline findings, low risk, bilateral: Secondary | ICD-10-CM | POA: Diagnosis not present

## 2021-08-25 DIAGNOSIS — H3562 Retinal hemorrhage, left eye: Secondary | ICD-10-CM | POA: Diagnosis not present

## 2021-08-25 DIAGNOSIS — Z961 Presence of intraocular lens: Secondary | ICD-10-CM | POA: Diagnosis not present

## 2021-08-25 DIAGNOSIS — H2512 Age-related nuclear cataract, left eye: Secondary | ICD-10-CM | POA: Diagnosis not present

## 2021-08-25 DIAGNOSIS — H3581 Retinal edema: Secondary | ICD-10-CM | POA: Diagnosis not present

## 2021-09-07 ENCOUNTER — Encounter (INDEPENDENT_AMBULATORY_CARE_PROVIDER_SITE_OTHER): Payer: Self-pay | Admitting: Ophthalmology

## 2021-09-07 ENCOUNTER — Ambulatory Visit (INDEPENDENT_AMBULATORY_CARE_PROVIDER_SITE_OTHER): Payer: Medicare Other | Admitting: Ophthalmology

## 2021-09-07 ENCOUNTER — Other Ambulatory Visit: Payer: Self-pay

## 2021-09-07 DIAGNOSIS — H35352 Cystoid macular degeneration, left eye: Secondary | ICD-10-CM | POA: Diagnosis not present

## 2021-09-07 DIAGNOSIS — H353221 Exudative age-related macular degeneration, left eye, with active choroidal neovascularization: Secondary | ICD-10-CM | POA: Diagnosis not present

## 2021-09-07 DIAGNOSIS — H2512 Age-related nuclear cataract, left eye: Secondary | ICD-10-CM | POA: Insufficient documentation

## 2021-09-07 DIAGNOSIS — H353131 Nonexudative age-related macular degeneration, bilateral, early dry stage: Secondary | ICD-10-CM | POA: Diagnosis not present

## 2021-09-07 DIAGNOSIS — D3131 Benign neoplasm of right choroid: Secondary | ICD-10-CM | POA: Diagnosis not present

## 2021-09-07 MED ORDER — BEVACIZUMAB 2.5 MG/0.1ML IZ SOSY
2.5000 mg | PREFILLED_SYRINGE | INTRAVITREAL | Status: AC | PRN
  Administered 2021-09-07: 2.5 mg via INTRAVITREAL

## 2021-09-07 NOTE — Assessment & Plan Note (Signed)
Massive and component of wet AMD but also suggest that there is no atrophy and that potential for some recovery of vision exists ?

## 2021-09-07 NOTE — Progress Notes (Signed)
? ? ?09/07/2021 ? ?  ? ?CHIEF COMPLAINT ?Patient presents for  ?Chief Complaint  ?Patient presents with  ? Retina Evaluation  ? ? ? ? ?HISTORY OF PRESENT ILLNESS: ?John Holland is a 71 y.o. male who presents to the clinic today for:  ? ?HPI   ? ? Retina Evaluation   ? ?      ? Laterality: left eye  ? Associated Symptoms: Negative for Flashes and Floaters  ? ?  ?  ? ? Comments   ?NP- mac hem w/edema OS, Referred by Dr. Shirleen Schirmer.  "Onset, month or so ago, vision dropped" ?Pt states "there is a gray blob in my left eye, I was assuming was a cataract. Dr Katy Fitch sent me here, I saw him on March 21st." Pt reports he has had the gray blob right in the middle of his vision and it started a month ago. Pt states he has had cataract surgery in his right eye in and currently has a floater in the right eye. ? ?  ?  ?Last edited by Hurman Horn, MD on 09/07/2021  2:02 PM.  ?  ? ? ?Referring physician: ?Dorothyann Peng, NP ?[facility] ?[place],  [place] [address] ? ?HISTORICAL INFORMATION:  ? ?Selected notes from the [facility] ?  ?   ? ?CURRENT MEDICATIONS: ?No current outpatient medications on file. (Ophthalmic Drugs)  ? ?No current facility-administered medications for this visit. (Ophthalmic Drugs)  ? ?Current Outpatient Medications (Other)  ?Medication Sig  ? diazepam (VALIUM) 5 MG tablet TAKE 1 TABLET(5 MG) BY MOUTH EVERY 6 HOURS AS NEEDED FOR ANXIETY  ? lisinopril (ZESTRIL) 10 MG tablet TAKE 1 TABLET(10 MG) BY MOUTH DAILY  ? mirtazapine (REMERON) 15 MG tablet TAKE 1 TABLET(15 MG) BY MOUTH AT BEDTIME  ? mupirocin ointment (BACTROBAN) 2 % Apply 1 application topically 2 (two) times daily.  ? naproxen (NAPROSYN) 500 MG tablet naproxen 500 mg tablet ? TAKE 1 TABLET BY MOUTH TWICE DAILY (Patient not taking: Reported on 03/25/2021)  ? nystatin cream (MYCOSTATIN) Apply 1 application topically 2 (two) times daily.  ? simvastatin (ZOCOR) 20 MG tablet TAKE 1 TABLET(20 MG) BY MOUTH AT BEDTIME  ? ?No current  facility-administered medications for this visit. (Other)  ? ? ? ? ?REVIEW OF SYSTEMS: ?ROS   ?Negative for: Constitutional, Gastrointestinal, Neurological, Skin, Genitourinary, Musculoskeletal, HENT, Endocrine, Cardiovascular, Eyes, Respiratory, Psychiatric, Allergic/Imm, Heme/Lymph ?Last edited by Hurman Horn, MD on 09/07/2021  2:02 PM.  ?  ? ? ? ?ALLERGIES ?Allergies  ?Allergen Reactions  ? Codeine Phosphate Nausea And Vomiting  ? ? ?PAST MEDICAL HISTORY ?Past Medical History:  ?Diagnosis Date  ? Anxiety   ? Arthritis   ? Bipolar 1 disorder ([place])   ? Depression   ? Hepatitis C 12/05/2015  ? Hyperlipidemia   ? ?Past Surgical History:  ?Procedure Laterality Date  ? ANKLE FRACTURE SURGERY    ? age 81-lt  ? arthroscopy arm  2003  ? right elbow bone frag  ? COLONOSCOPY    ? ROTATOR CUFF REPAIR Right 2015?  ? ROTATOR CUFF REPAIR Left 04/2020  ? ? ?FAMILY HISTORY ?Family History  ?Problem Relation Age of Onset  ? COPD Mother   ? Heart disease Mother   ? Colon cancer Neg Hx   ? Stomach cancer Neg Hx   ? ? ?SOCIAL HISTORY ?Social History  ? ?Tobacco Use  ? Smoking status: Former  ?  Types: Cigarettes  ?  Quit date:  03/02/2011  ?  Years since quitting: 10.5  ? Smokeless tobacco: Never  ?Substance Use Topics  ? Alcohol use: No  ? Drug use: No  ? ?  ? ?  ? ?OPHTHALMIC EXAM: ? ?Base Eye Exam   ? ? Visual Acuity (ETDRS)   ? ?   Right Left  ? Dist [place] 20/25 CF at 3'  ? Dist ph [place]  NI  ?Patient cannot see centrally OS, looks around to find fingers for count fingers. ? ?  ?  ? ? Tonometry (Tonopen, 1:32 PM)   ? ?   Right Left  ? Pressure 13 11  ? ?  ?  ? ? Pupils   ? ?   Pupils Dark Light APD  ? Right PERRL 5 3 None  ? Left PERRL 5 3 None  ? ?  ?  ? ? Visual Fields (Counting fingers)   ? ?   Left Right  ?   Full  ? Restrictions Partial inner superior temporal, inferior temporal, superior nasal, inferior nasal deficiencies   ? ?  ?  ? ? Extraocular Movement   ? ?   Right Left  ?  Full Full  ? ?  ?  ? ? Neuro/Psych   ? ? Oriented x3:  Yes  ? Mood/Affect: Normal  ? ?  ?  ? ? Dilation   ? ? Both eyes: 1.0% Mydriacyl, 2.5% Phenylephrine @ 1:31 PM  ? ?  ?  ? ?  ? ?Slit Lamp and Fundus Exam   ? ? External Exam   ? ?   Right Left  ? External Normal Normal  ? ?  ?  ? ? Slit Lamp Exam   ? ?   Right Left  ? Lids/Lashes Normal Normal  ? Conjunctiva/Sclera White and quiet White and quiet  ? Cornea Clear Clear  ? Anterior Chamber Deep and quiet Deep and quiet  ? Iris Round and reactive Round and reactive  ? Lens Centered posterior chamber intraocular lens 2+ Nuclear sclerosis  ? Anterior Vitreous Normal Normal  ? ?  ?  ? ? Fundus Exam   ? ?   Right Left  ? Posterior Vitreous Normal Normal  ? Disc Normal Normal  ? C/D Ratio 0.3 0.3  ? Macula Hard drusen, Early age related macular degeneration Roughly 1.5 disc area in 2 disc diameter size subfoveal white lesion OS with rim of hemorrhage inferiorly suggesting mature CNVM present for some months, no large bright red bleeding  ? Vessels Normal Normal  ? Periphery Normal, incidental small nasal choroidal nevus, less than 1 disc area no high risk features Normal  ? ?  ?  ? ?  ? ? ?IMAGING AND PROCEDURES  ?Imaging and Procedures for 09/07/21 ? ?OCT, Retina - OU - Both Eyes   ? ?   ?Right Eye ?Quality was good. Scan locations included subfoveal. Central Foveal Thickness: 269. Progression has no prior data. Findings include retinal drusen .  ? ?Left Eye ?Quality was good. Scan locations included subfoveal. Central Foveal Thickness: 916. Progression has no prior data. Findings include cystoid macular edema, choroidal neovascular membrane.  ? ?Notes ?OS with massive subfoveal white disciform looking scar well-circumscribed with intimacy of the outer retinal structures into the RPE layer , As well as in the outer retina.   Not amenable to extraction nor flushing with subretinal surgery ? ?  ? ?Color Fundus Photography Optos - OU - Both Eyes   ? ?   ?  Right Eye ?Progression has no prior data. Disc findings include normal  observations. Macula : drusen. Vessels : normal observations. Periphery : normal observations.  ? ?Left Eye ?Progression has no prior data. Macula : edema. Vessels : normal observations. Periphery : normal observations.  ? ?Notes ?CME and disciform scar subfoveal ? ?  ? ?Intravitreal Injection, Pharmacologic Agent - OS - Left Eye   ? ?   ?Time Out ?09/07/2021. 2:11 PM. Confirmed correct patient, procedure, site, and patient consented.  ? ?Anesthesia ?Topical anesthesia was used. Anesthetic medications included Lidocaine 4%.  ? ?Procedure ?Preparation included 5% betadine to ocular surface, 10% betadine to eyelids, Tobramycin 0.3%. A 30 gauge needle was used.  ? ?Injection: ?2.5 mg bevacizumab 2.5 MG/0.1ML ?  Route: Intravitreal, Site: Left Eye ?  [place]: [id number]  ? ?Post-op ?Post injection exam found visual acuity of at least counting fingers. The patient tolerated the procedure well. There were no complications. The patient received written and verbal post procedure care education. Post injection medications were not given.  ? ?  ? ? ?  ?  ? ?  ?ASSESSMENT/PLAN: ? ?Exudative age-related macular degeneration of left eye with active choroidal neovascularization ([place]) ?The nature of wet macular degeneration was discussed with the patient.  Forms of therapy reviewed include the use of Anti-VEGF medications injected painlessly into the eye, as well as other possible treatment modalities, including thermal laser therapy. Fellow eye involvement and risks were discussed with the patient. Upon the finding of wet age related macular degeneration, treatment will be offered. The treatment regimen is on a treat as needed basis with the intent to treat if necessary and extend interval of exams when possible. On average 1 out of 6 patients do not need lifetime therapy. However, the risk of recurrent disease is high for a lifetime.  Initially monthly, then periodic, examinations and evaluations will determine whether the next  treatment is required on the day of the examination. ? ?I will explained to the patient that this wet macular condition is much like having a week growing in her garden.  I explained that if the we does not halted and it

## 2021-09-07 NOTE — Assessment & Plan Note (Signed)
OS, with visually significant cataract will need cataract surgery in order to allow for maximal visual potential in the future.  Also will decrease risk of complications as well as decrease post injection spikes in intraocular pressure that can sometimes occur ?

## 2021-09-07 NOTE — Assessment & Plan Note (Signed)
The nature of wet macular degeneration was discussed with the patient.  Forms of therapy reviewed include the use of Anti-VEGF medications injected painlessly into the eye, as well as other possible treatment modalities, including thermal laser therapy. Fellow eye involvement and risks were discussed with the patient. Upon the finding of wet age related macular degeneration, treatment will be offered. The treatment regimen is on a treat as needed basis with the intent to treat if necessary and extend interval of exams when possible. On average 1 out of 6 patients do not need lifetime therapy. However, the risk of recurrent disease is high for a lifetime.  Initially monthly, then periodic, examinations and evaluations will determine whether the next treatment is required on the day of the examination. ? ?I will explained to the patient that this wet macular condition is much like having a week growing in her garden.  I explained that if the we does not halted and its growth will continue to destroy more more of the garden similarly this blood vessel growing in the center of the vision in the left eye . is like that same weed growing in the garden ? ?I will recommend to the patient that we have commenced therapy with intravitreal Avastin today and follow-up again in 5 weeks to study and evaluate the response to therapy  ? ? ? ?will also explained to the patient that overlying cataract surgery will be required in the left eye but he will not see the improved visual acuity is may have occurred in the right eye due to the vision limiting scar in the center of his vision.  Nonetheless medically I will help in his ongoing care to have the cataract surgery performed in order  to maximize visual potential in the future ?

## 2021-09-07 NOTE — Assessment & Plan Note (Signed)
Minor, small, no high risk features ?

## 2021-09-17 ENCOUNTER — Other Ambulatory Visit: Payer: Self-pay | Admitting: Adult Health

## 2021-10-12 ENCOUNTER — Encounter (INDEPENDENT_AMBULATORY_CARE_PROVIDER_SITE_OTHER): Payer: Self-pay | Admitting: Ophthalmology

## 2021-10-12 ENCOUNTER — Ambulatory Visit (INDEPENDENT_AMBULATORY_CARE_PROVIDER_SITE_OTHER): Payer: Medicare Other | Admitting: Ophthalmology

## 2021-10-12 DIAGNOSIS — H353221 Exudative age-related macular degeneration, left eye, with active choroidal neovascularization: Secondary | ICD-10-CM | POA: Diagnosis not present

## 2021-10-12 MED ORDER — BEVACIZUMAB 2.5 MG/0.1ML IZ SOSY
2.5000 mg | PREFILLED_SYRINGE | INTRAVITREAL | Status: AC | PRN
  Administered 2021-10-12: 2.5 mg via INTRAVITREAL

## 2021-10-12 NOTE — Progress Notes (Signed)
? ? ?10/12/2021 ? ?  ? ?CHIEF COMPLAINT ?Patient presents for  ?Chief Complaint  ?Patient presents with  ? Macular Degeneration  ? ?History of wet AMD, status post injection number one 1 month previous ? ? ?HISTORY OF PRESENT ILLNESS: ?John Holland is a 71 y.o. male who presents to the clinic today for:  ? ?HPI   ?5 weeks for DILATE, OS, AVASTIN OCT. ?Pt stated improvement in vision but it is still blurry. ?Pt denies FOL and floaters in right eye. ? ? ? ? ?Last edited by Silvestre Moment on 10/12/2021  2:12 PM.  ?  ? ? ?Referring physician: ?Dorothyann Peng, NP ?[facility] ?[place],  [place] [address] ? ?HISTORICAL INFORMATION:  ? ?Selected notes from the [facility] ?  ?   ? ?CURRENT MEDICATIONS: ?No current outpatient medications on file. (Ophthalmic Drugs)  ? ?No current facility-administered medications for this visit. (Ophthalmic Drugs)  ? ?Current Outpatient Medications (Other)  ?Medication Sig  ? diazepam (VALIUM) 5 MG tablet TAKE 1 TABLET(5 MG) BY MOUTH EVERY 6 HOURS AS NEEDED FOR ANXIETY  ? lisinopril (ZESTRIL) 10 MG tablet TAKE 1 TABLET(10 MG) BY MOUTH DAILY  ? mirtazapine (REMERON) 15 MG tablet TAKE 1 TABLET(15 MG) BY MOUTH AT BEDTIME  ? mupirocin ointment (BACTROBAN) 2 % Apply 1 application topically 2 (two) times daily.  ? naproxen (NAPROSYN) 500 MG tablet naproxen 500 mg tablet ? TAKE 1 TABLET BY MOUTH TWICE DAILY (Patient not taking: Reported on 03/25/2021)  ? nystatin cream (MYCOSTATIN) Apply 1 application topically 2 (two) times daily.  ? simvastatin (ZOCOR) 20 MG tablet TAKE 1 TABLET(20 MG) BY MOUTH AT BEDTIME  ? ?No current facility-administered medications for this visit. (Other)  ? ? ? ? ?REVIEW OF SYSTEMS: ?ROS   ?Negative for: Constitutional, Gastrointestinal, Neurological, Skin, Genitourinary, Musculoskeletal, HENT, Endocrine, Cardiovascular, Eyes, Respiratory, Psychiatric, Allergic/Imm, Heme/Lymph ?Last edited by Hurman Horn, MD on 10/12/2021  2:57 PM.  ?  ? ? ? ?ALLERGIES ?Allergies   ?Allergen Reactions  ? Codeine Phosphate Nausea And Vomiting  ? ? ?PAST MEDICAL HISTORY ?Past Medical History:  ?Diagnosis Date  ? Anxiety   ? Arthritis   ? Bipolar 1 disorder ([place])   ? Depression   ? Hepatitis C 12/05/2015  ? Hyperlipidemia   ? ?Past Surgical History:  ?Procedure Laterality Date  ? ANKLE FRACTURE SURGERY    ? age 74-lt  ? arthroscopy arm  2003  ? right elbow bone frag  ? COLONOSCOPY    ? ROTATOR CUFF REPAIR Right 2015?  ? ROTATOR CUFF REPAIR Left 04/2020  ? ? ?FAMILY HISTORY ?Family History  ?Problem Relation Age of Onset  ? COPD Mother   ? Heart disease Mother   ? Colon cancer Neg Hx   ? Stomach cancer Neg Hx   ? ? ?SOCIAL HISTORY ?Social History  ? ?Tobacco Use  ? Smoking status: Former  ?  Types: Cigarettes  ?  Quit date: 03/02/2011  ?  Years since quitting: 10.6  ? Smokeless tobacco: Never  ?Substance Use Topics  ? Alcohol use: No  ? Drug use: No  ? ?  ? ?  ? ?OPHTHALMIC EXAM: ? ?Base Eye Exam   ? ? Visual Acuity (ETDRS)   ? ?   Right Left  ? Dist [place] 20/30 -2 CF at 3'  ? ?  ?  ? ? Tonometry (Tonopen, 2:17 PM)   ? ?   Right Left  ? Pressure 15 17  ? ?  ?  ? ?  Pupils   ? ?   Pupils APD  ? Right PERRL None  ? Left PERRL None  ? ?  ?  ? ? Visual Fields   ? ?   Left Right  ?  Full Full  ? ?  ?  ? ? Extraocular Movement   ? ?   Right Left  ?  Full Full  ? ?  ?  ? ? Neuro/Psych   ? ? Oriented x3: Yes  ? Mood/Affect: Normal  ? ?  ?  ? ? Dilation   ? ? Left eye: 2.5% Phenylephrine, 1.0% Mydriacyl @ 2:17 PM  ? ?  ?  ? ?  ? ?Slit Lamp and Fundus Exam   ? ? External Exam   ? ?   Right Left  ? External Normal Normal  ? ?  ?  ? ? Slit Lamp Exam   ? ?   Right Left  ? Lids/Lashes Normal Normal  ? Conjunctiva/Sclera White and quiet White and quiet  ? Cornea Clear Clear  ? Anterior Chamber Deep and quiet Deep and quiet  ? Iris Round and reactive Round and reactive  ? Lens Centered posterior chamber intraocular lens 2+ Nuclear sclerosis  ? Anterior Vitreous Normal Normal  ? ?  ?  ? ? Fundus Exam   ? ?   Right Left   ? Posterior Vitreous  Normal  ? Disc  Normal  ? C/D Ratio  0.3  ? Macula  Roughly 1.5 disc area in 2 disc diameter size subfoveal white lesion OS with rim of hemorrhage inferiorly suggesting mature CNVM present for some months, no large bright red bleeding, smaller overall post injection #1  ? Vessels  Normal  ? Periphery  Normal  ? ?  ?  ? ?  ? ? ?IMAGING AND PROCEDURES  ?Imaging and Procedures for 10/12/21 ? ?OCT, Retina - OU - Both Eyes   ? ?   ?Right Eye ?Quality was good. Scan locations included subfoveal. Central Foveal Thickness: 269. Progression has no prior data. Findings include retinal drusen .  ? ?Left Eye ?Quality was good. Scan locations included subfoveal. Central Foveal Thickness: 905. Progression has improved. Findings include cystoid macular edema, choroidal neovascular membrane.  ? ?Notes ?OS with massive subfoveal white disciform looking scar well-circumscribed with intimacy of the outer retinal structures into the RPE layer however much less intraretinal fluid and subretinal fluid as compared to onset.  Still active lesion.  We will continue to treat ? ? ? ?  ? ?Intravitreal Injection, Pharmacologic Agent - OS - Left Eye   ? ?   ?Time Out ?10/12/2021. 2:59 PM. Confirmed correct patient, procedure, site, and patient consented.  ? ?Anesthesia ?Topical anesthesia was used. Anesthetic medications included Lidocaine 4%.  ? ?Procedure ?Preparation included 5% betadine to ocular surface, 10% betadine to eyelids, Tobramycin 0.3%. A 30 gauge needle was used.  ? ?Injection: ?2.5 mg bevacizumab 2.5 MG/0.1ML ?  Route: Intravitreal, Site: Left Eye ?  [place]: [id number], [place]: [id number]  ? ?Post-op ?Post injection exam found visual acuity of at least counting fingers. The patient tolerated the procedure well. There were no complications. The patient received written and verbal post procedure care education. Post injection medications were not given.  ? ?  ? ? ?  ?  ? ?  ?ASSESSMENT/PLAN: ? ?Exudative age-related  macular degeneration of left eye with active choroidal neovascularization ([place]) ?Overall improved intraretinal fluid subretinal fluid left eye still with massive subfoveal elevated disciform scar.  We will repeat injection today and will likely change to [facility] next in order to hasten recovery  ? ?  ICD-10-CM   ?1. Exudative age-related macular degeneration of left eye with active choroidal neovascularization ([facility])  H35.3221 OCT, Retina - OU - Both Eyes  ?  Intravitreal Injection, Pharmacologic Agent - OS - Left Eye  ?  bevacizumab (AVASTIN) SOSY 2.5 mg  ?  ? ? ?1.  OD stable overall, no signs of change ? ?2.  OS improved post injection of 1 antivegF, Avastin for large subfoveal disciform scar, with massive intraretinal fluid and thickening, less thickening today, improved acuity per patient ? ?Repeat Avastin today ? ?3.  To hasten improvement, will change to [place] OS next ? ?Ophthalmic Meds Ordered this visit:  ?Meds ordered this encounter  ?Medications  ? bevacizumab (AVASTIN) SOSY 2.5 mg  ? ? ?  ? ?Return in about 1 month (around 11/12/2021) for dilate, OS, EYLEA OCT,, this is a change. ? ?There are no Patient Instructions on file for this visit. ? ? ?Explained the diagnoses, plan, and follow up with the patient and they expressed understanding.  Patient expressed understanding of the importance of proper follow up care.  ? ?Clent Demark. Indie Boehne M.D. ?Diseases & Surgery of the Retina and Vitreous ?[facility] ?10/12/21 ? ? ? ? ?Abbreviations: ?M myopia (nearsighted); A astigmatism; H hyperopia (farsighted); P presbyopia; Mrx spectacle prescription;  CTL contact lenses; OD right eye; OS left eye; OU both eyes  XT exotropia; ET esotropia; PEK punctate epithelial keratitis; PEE punctate epithelial erosions; DES dry eye syndrome; MGD meibomian gland dysfunction; ATs artificial tears; PFAT's preservative free artificial tears; [place] nuclear sclerotic cataract; PSC posterior subcapsular cataract; ERM epi-retinal  membrane; PVD posterior vitreous detachment; RD retinal detachment; DM diabetes mellitus; DR diabetic retinopathy; NPDR non-proliferative diabetic retinopathy; PDR proliferative diabetic retinopathy; CSME

## 2021-10-12 NOTE — Assessment & Plan Note (Signed)
Overall improved intraretinal fluid subretinal fluid left eye still with massive subfoveal elevated disciform scar.  We will repeat injection today and will likely change to [facility] next in order to hasten recovery ?

## 2021-10-18 ENCOUNTER — Other Ambulatory Visit: Payer: Self-pay | Admitting: Adult Health

## 2021-10-20 NOTE — Telephone Encounter (Signed)
Spoke to patient. CPE scheduled. Patient states he doesn't need the refill and still has some med left. ?

## 2021-11-13 ENCOUNTER — Other Ambulatory Visit: Payer: Self-pay | Admitting: Adult Health

## 2021-11-13 ENCOUNTER — Ambulatory Visit (INDEPENDENT_AMBULATORY_CARE_PROVIDER_SITE_OTHER): Payer: Medicare Other | Admitting: Adult Health

## 2021-11-13 ENCOUNTER — Encounter: Payer: Self-pay | Admitting: Adult Health

## 2021-11-13 VITALS — BP 160/80 | HR 89 | Temp 98.6°F | Ht 67.0 in | Wt 150.0 lb

## 2021-11-13 DIAGNOSIS — I10 Essential (primary) hypertension: Secondary | ICD-10-CM | POA: Diagnosis not present

## 2021-11-13 DIAGNOSIS — F418 Other specified anxiety disorders: Secondary | ICD-10-CM | POA: Diagnosis not present

## 2021-11-13 DIAGNOSIS — E782 Mixed hyperlipidemia: Secondary | ICD-10-CM | POA: Diagnosis not present

## 2021-11-13 DIAGNOSIS — N400 Enlarged prostate without lower urinary tract symptoms: Secondary | ICD-10-CM | POA: Diagnosis not present

## 2021-11-13 LAB — LIPID PANEL
Cholesterol: 188 mg/dL (ref 0–200)
HDL: 41.4 mg/dL (ref >39.00)
LDL Cholesterol: 108 mg/dL — ABNORMAL HIGH (ref 0–99)
NonHDL: 146.64
Total CHOL/HDL Ratio: 5
Triglycerides: 194 mg/dL — ABNORMAL HIGH (ref 0.0–149.0)
VLDL: 38.8 mg/dL (ref 0.0–40.0)

## 2021-11-13 LAB — CBC WITH DIFFERENTIAL/PLATELET
Basophils Absolute: 0.1 10*3/uL (ref 0.0–0.1)
Basophils Relative: 1.1 % (ref 0.0–3.0)
Eosinophils Absolute: 0.2 10*3/uL (ref 0.0–0.7)
Eosinophils Relative: 2.8 % (ref 0.0–5.0)
HCT: 44.3 % (ref 39.0–52.0)
Hemoglobin: 15.3 g/dL (ref 13.0–17.0)
Lymphocytes Relative: 32 % (ref 12.0–46.0)
Lymphs Abs: 1.9 10*3/uL (ref 0.7–4.0)
MCHC: 34.5 g/dL (ref 30.0–36.0)
MCV: 91.9 fl (ref 78.0–100.0)
Monocytes Absolute: 0.6 10*3/uL (ref 0.1–1.0)
Monocytes Relative: 9.6 % (ref 3.0–12.0)
Neutro Abs: 3.3 10*3/uL (ref 1.4–7.7)
Neutrophils Relative %: 54.5 % (ref 43.0–77.0)
Platelets: 225 10*3/uL (ref 150.0–400.0)
RBC: 4.82 Mil/uL (ref 4.22–5.81)
RDW: 12.6 % (ref 11.5–15.5)
WBC: 6 10*3/uL (ref 4.0–10.5)

## 2021-11-13 LAB — COMPREHENSIVE METABOLIC PANEL
ALT: 25 U/L (ref 0–53)
AST: 21 U/L (ref 0–37)
Albumin: 4.7 g/dL (ref 3.5–5.2)
Alkaline Phosphatase: 55 U/L (ref 39–117)
BUN: 12 mg/dL (ref 6–23)
CO2: 26 mEq/L (ref 19–32)
Calcium: 9.5 mg/dL (ref 8.4–10.5)
Chloride: 105 mEq/L (ref 96–112)
Creatinine, Ser: 1.16 mg/dL (ref 0.40–1.50)
GFR: 63.32 mL/min (ref >60.00)
Glucose, Bld: 102 mg/dL — ABNORMAL HIGH (ref 70–99)
Potassium: 4.5 mEq/L (ref 3.5–5.1)
Sodium: 140 mEq/L (ref 135–145)
Total Bilirubin: 0.4 mg/dL (ref 0.2–1.2)
Total Protein: 7.3 g/dL (ref 6.0–8.3)

## 2021-11-13 LAB — TSH: TSH: 1.32 u[IU]/mL (ref 0.35–5.50)

## 2021-11-13 LAB — PSA: PSA: 2.17 ng/mL (ref 0.10–4.00)

## 2021-11-13 MED ORDER — DIAZEPAM 5 MG PO TABS: ORAL_TABLET | ORAL | 2 refills | Status: DC

## 2021-11-13 MED ORDER — LISINOPRIL 10 MG PO TABS: ORAL_TABLET | ORAL | 3 refills | Status: DC

## 2021-11-13 NOTE — Progress Notes (Signed)
Subjective:    Patient ID: John Holland, male    DOB: 18-Dec-1950, 71 y.o.   MRN: [id number]  HPI Patient presents for yearly preventative medicine examination. He is a pleasant 71 year old male who  has a past medical history of Anxiety, Arthritis, Bipolar 1 disorder ([place]), Depression, Hepatitis C (12/05/2015), Hyperlipidemia, and Macular degeneration of left eye.  Hyperlipidemia-managed with simvastatin 20 mg daily.  Denies myalgia or fatigue Lab Results  Component Value Date   CHOL 229 (H) 08/15/2020   HDL 39.70 08/15/2020   LDLCALC 151 (H) 08/15/2020   TRIG 193.0 (H) 08/15/2020   CHOLHDL 6 08/15/2020   Essential hypertension-managed with lisinopril 10 mg daily.  He denies dizziness, lightheadedness, chest pain, or shortness of breath. He did not take his medication prior to the appointment. Does not check at home but has a cuff.  BP Readings from Last 3 Encounters:  11/13/21 (!) 160/80  08/15/20 140/80  03/11/20 (!) 168/90   Anxiety/depression - currently managed with Valium 5 mg 3 times daily and Remeron 7.5 mg QHS. He feels as though his symptoms are well controlled.   BPH-asymptomatic  Macular Degeneration of left eye - diagnosed in March of this year. Seeing Dr. Zadie Rhine. Has received two injections and reports improvement    All immunizations and health maintenance protocols were reviewed with the patient and needed orders were placed.  Appropriate screening laboratory values were ordered for the patient including screening of hyperlipidemia, renal function and hepatic function. If indicated by BPH, a PSA was ordered.  Medication reconciliation,  past medical history, social history, problem list and allergies were reviewed in detail with the patient  Goals were established with regard to weight loss, exercise, and  diet in compliance with medications. He is taking short walks with his dog and is eating healthy.   Wt Readings from Last 3 Encounters:  11/13/21 150 lb  (68 kg)  08/15/20 145 lb 3.2 oz (65.9 kg)  03/11/20 139 lb 2 oz (63.1 kg)   He is on the recall list for colon cancer screening with [facility]  Review of Systems  Constitutional: Negative.   HENT: Negative.    Eyes:  Positive for visual disturbance.  Respiratory: Negative.    Cardiovascular: Negative.   Gastrointestinal: Negative.   Endocrine: Negative.   Genitourinary: Negative.   Musculoskeletal: Negative.   Skin: Negative.   Allergic/Immunologic: Negative.   Neurological: Negative.   Hematological: Negative.   Psychiatric/Behavioral: Negative.    All other systems reviewed and are negative.  Past Medical History:  Diagnosis Date   Anxiety    Arthritis    Bipolar 1 disorder ([place])    Depression    Hepatitis C 12/05/2015   Hyperlipidemia    Macular degeneration of left eye     Social History   Socioeconomic History   Marital status: Married    Spouse name: Not on file   Number of children: Not on file   Years of education: Not on file   Highest education level: Not on file  Occupational History   Occupation: Material Therapist, sports: [employer]  Tobacco Use   Smoking status: Former    Types: Cigarettes    Quit date: 03/02/2011    Years since quitting: 10.7   Smokeless tobacco: Never  Substance and Sexual Activity   Alcohol use: No   Drug use: No   Sexual activity: Not on file  Other Topics Concern   Not on  file  Social History Narrative   Works in a [employer] Strain: [place]  (03/25/2021)   Overall Financial Resource Strain (CARDIA)    Difficulty of Paying Living Expenses: Not hard at all  Food Insecurity: No Food Insecurity (03/25/2021)   Hunger Vital Sign    Worried About Running Out of Food in the Last Year: Never true    [place] in the Last Year: Never true  Transportation Needs: No Transportation Needs (03/25/2021)   PRAPARE - Radiographer, therapeutic (Medical): No    Lack of Transportation (Non-Medical): No  Physical Activity: Insufficiently Active (03/25/2021)   Exercise Vital Sign    Days of Exercise per Week: 3 days    Minutes of Exercise per Session: 30 min  Stress: No Stress Concern Present (03/25/2021)   [place]    Feeling of Stress : Not at all  Social Connections: Moderately Isolated (03/25/2021)   Social Connection and Isolation Panel [NHANES]    Frequency of Communication with Friends and Family: Twice a week    Frequency of Social Gatherings with Friends and Family: Twice a week    Attends Religious Services: More than 4 times per year    Active Member of [employer] or Organizations: No    Attends Archivist Meetings: Never    Marital Status: Widowed  Intimate Partner Violence: Not At Risk (03/25/2021)   Humiliation, Afraid, Rape, and Kick questionnaire    Fear of Current or Ex-Partner: No    Emotionally Abused: No    Physically Abused: No    Sexually Abused: No    Past Surgical History:  Procedure Laterality Date   ANKLE FRACTURE SURGERY     age 34-lt   arthroscopy arm  2003   right elbow bone frag   COLONOSCOPY     ROTATOR CUFF REPAIR Right 2015?   ROTATOR CUFF REPAIR Left 04/2020    Family History  Problem Relation Age of Onset   COPD Mother    Heart disease Mother    Colon cancer Neg Hx    Stomach cancer Neg Hx     Allergies  Allergen Reactions   Codeine Phosphate Nausea And Vomiting    Current Outpatient Medications on File Prior to Visit  Medication Sig Dispense Refill   diazepam (VALIUM) 5 MG tablet TAKE 1 TABLET(5 MG) BY MOUTH EVERY 6 HOURS AS NEEDED FOR ANXIETY 120 tablet 2   lisinopril (ZESTRIL) 10 MG tablet TAKE 1 TABLET(10 MG) BY MOUTH DAILY 90 tablet 3   mirtazapine (REMERON) 15 MG tablet TAKE 1 TABLET(15 MG) BY MOUTH AT BEDTIME 30 tablet 0   mupirocin ointment (BACTROBAN) 2 % Apply 1 application  topically 2 (two) times daily. 22 g 0   nystatin cream (MYCOSTATIN) Apply 1 application topically 2 (two) times daily. 30 g 3   simvastatin (ZOCOR) 20 MG tablet TAKE 1 TABLET(20 MG) BY MOUTH AT BEDTIME 90 tablet 3   No current facility-administered medications on file prior to visit.    BP (!) 160/80   Pulse 89   Temp 98.6 F (37 C) (Oral)   Ht '5\' 7"'$  (1.702 m)   Wt 150 lb (68 kg)   SpO2 95%   BMI 23.49 kg/m       Objective:   Physical Exam Vitals and nursing note reviewed.  Constitutional:  General: He is not in acute distress.    Appearance: Normal appearance. He is well-developed and normal weight.  HENT:     Head: Normocephalic and atraumatic.     Right Ear: Tympanic membrane, ear canal and external ear normal. There is no impacted cerumen.     Left Ear: Tympanic membrane, ear canal and external ear normal. There is no impacted cerumen.     Nose: Nose normal. No congestion or rhinorrhea.     Mouth/Throat:     Mouth: Mucous membranes are moist.     Pharynx: Oropharynx is clear. No oropharyngeal exudate or posterior oropharyngeal erythema.  Eyes:     General:        Right eye: No discharge.        Left eye: No discharge.     Extraocular Movements: Extraocular movements intact.     Conjunctiva/sclera: Conjunctivae normal.     Pupils: Pupils are equal, round, and reactive to light.  Neck:     Vascular: No carotid bruit.     Trachea: No tracheal deviation.  Cardiovascular:     Rate and Rhythm: Normal rate and regular rhythm.     Pulses: Normal pulses.     Heart sounds: Normal heart sounds. No murmur heard.    No friction rub. No gallop.  Pulmonary:     Effort: Pulmonary effort is normal. No respiratory distress.     Breath sounds: Normal breath sounds. No stridor. No wheezing, rhonchi or rales.  Chest:     Chest wall: No tenderness.  Abdominal:     General: Bowel sounds are normal. There is no distension.     Palpations: Abdomen is soft. There is no mass.      Tenderness: There is no abdominal tenderness. There is no right CVA tenderness, left CVA tenderness, guarding or rebound.     Hernia: No hernia is present.  Musculoskeletal:        General: No swelling, tenderness, deformity or signs of injury. Normal range of motion.     Right lower leg: No edema.     Left lower leg: No edema.  Lymphadenopathy:     Cervical: No cervical adenopathy.  Skin:    General: Skin is warm and dry.     Capillary Refill: Capillary refill takes less than 2 seconds.     Coloration: Skin is not jaundiced or pale.     Findings: No bruising, erythema, lesion or rash.  Neurological:     General: No focal deficit present.     Mental Status: He is alert and oriented to person, place, and time.     Cranial Nerves: No cranial nerve deficit.     Sensory: No sensory deficit.     Motor: No weakness.     Coordination: Coordination normal.     Gait: Gait normal.     Deep Tendon Reflexes: Reflexes normal.  Psychiatric:        Mood and Affect: Mood normal.        Behavior: Behavior normal.        Thought Content: Thought content normal.        Judgment: Judgment normal.       Assessment & Plan:  1. Essential hypertension - Well controlled. No change in medications  - CBC with Differential/Platelet; Future - Comprehensive metabolic panel; Future - Lipid panel; Future - TSH; Future  2. Depression with anxiety - Continue with Remeron and Valium  3. Mixed hyperlipidemia - Consider increase in statin  -  CBC with Differential/Platelet; Future - Comprehensive metabolic panel; Future - Lipid panel; Future - TSH; Future  4. Benign prostatic hyperplasia without lower urinary tract symptoms  - PSA; Future  Dorothyann Peng, NP

## 2021-11-16 ENCOUNTER — Ambulatory Visit (INDEPENDENT_AMBULATORY_CARE_PROVIDER_SITE_OTHER): Payer: Medicare Other | Admitting: Ophthalmology

## 2021-11-16 ENCOUNTER — Encounter (INDEPENDENT_AMBULATORY_CARE_PROVIDER_SITE_OTHER): Payer: Self-pay | Admitting: Ophthalmology

## 2021-11-16 DIAGNOSIS — H2512 Age-related nuclear cataract, left eye: Secondary | ICD-10-CM | POA: Diagnosis not present

## 2021-11-16 DIAGNOSIS — H353221 Exudative age-related macular degeneration, left eye, with active choroidal neovascularization: Secondary | ICD-10-CM

## 2021-11-16 MED ORDER — AFLIBERCEPT 2MG/0.05ML IZ SOLN FOR KALEIDOSCOPE
2.0000 mg | INTRAVITREAL | Status: AC | PRN
  Administered 2021-11-16: 2 mg via INTRAVITREAL

## 2021-11-16 NOTE — Progress Notes (Signed)
11/16/2021     CHIEF COMPLAINT Patient presents for  Chief Complaint  Patient presents with   Macular Degeneration      HISTORY OF PRESENT ILLNESS: John Holland is a 71 y.o. male who presents to the clinic today for:   HPI   1 month for DILATE, OS, EYLEA, OCT. Pt reports vision has improved since last visit. Pt stated, "Since the beginning almost daily, I can see more light. theres still one place that is a blackout. Ive lost most peripheral, but there is more light." Pt denies new onset symptoms.  Last edited by Silvestre Moment on 11/16/2021  2:59 PM.      Referring physician: Dorothyann Peng, NP [place],  [place] [address]  HISTORICAL INFORMATION:   Selected notes from the [facility]: No current outpatient medications on file. (Ophthalmic Drugs)   No current facility-administered medications for this visit. (Ophthalmic Drugs)   Current Outpatient Medications (Other)  Medication Sig   diazepam (VALIUM) 5 MG tablet TAKE 1 TABLET(5 MG) BY MOUTH EVERY 6 HOURS AS NEEDED FOR ANXIETY   lisinopril (ZESTRIL) 10 MG tablet TAKE 1 TABLET(10 MG) BY MOUTH DAILY   mirtazapine (REMERON) 15 MG tablet TAKE 1 TABLET(15 MG) BY MOUTH AT BEDTIME   mupirocin ointment (BACTROBAN) 2 % Apply 1 application topically 2 (two) times daily.   nystatin cream (MYCOSTATIN) Apply 1 application topically 2 (two) times daily.   simvastatin (ZOCOR) 20 MG tablet TAKE 1 TABLET(20 MG) BY MOUTH AT BEDTIME   No current facility-administered medications for this visit. (Other)      REVIEW OF SYSTEMS: ROS   Negative for: Constitutional, Gastrointestinal, Neurological, Skin, Genitourinary, Musculoskeletal, HENT, Endocrine, Cardiovascular, Eyes, Respiratory, Psychiatric, Allergic/Imm, Heme/Lymph Last edited by Silvestre Moment on 11/16/2021  2:59 PM.       ALLERGIES Allergies  Allergen Reactions   Codeine Phosphate Nausea And Vomiting    PAST MEDICAL  HISTORY Past Medical History:  Diagnosis Date   Anxiety    Arthritis    Bipolar 1 disorder ([place])    Depression    Hepatitis C 12/05/2015   Hyperlipidemia    Macular degeneration of left eye    Past Surgical History:  Procedure Laterality Date   ANKLE FRACTURE SURGERY     age 68-lt   arthroscopy arm  2003   right elbow bone frag   COLONOSCOPY     ROTATOR CUFF REPAIR Right 2015?   ROTATOR CUFF REPAIR Left 04/2020    FAMILY HISTORY Family History  Problem Relation Age of Onset   COPD Mother    Heart disease Mother    Colon cancer Neg Hx    Stomach cancer Neg Hx     SOCIAL HISTORY Social History   Tobacco Use   Smoking status: Former    Types: Cigarettes    Quit date: 03/02/2011    Years since quitting: 10.7   Smokeless tobacco: Never  Substance Use Topics   Alcohol use: No   Drug use: No         OPHTHALMIC EXAM:  Base Eye Exam     Visual Acuity (ETDRS)       Right Left   Dist [place] 20/50 -1 CF at 3'   Dist ph [place] 20/25 -1          Tonometry (Tonopen, 3:03 PM)       Right Left   Pressure 15 14  Pupils       Pupils APD   Right PERRL None   Left PERRL None         Visual Fields       Left Right    Full Full         Extraocular Movement       Right Left    Full Full         Neuro/Psych     Oriented x3: Yes   Mood/Affect: Normal         Dilation     Left eye: 2.5% Phenylephrine, 1.0% Mydriacyl @ 3:03 PM           Slit Lamp and Fundus Exam     External Exam       Right Left   External Normal Normal         Slit Lamp Exam       Right Left   Lids/Lashes Normal Normal   Conjunctiva/Sclera White and quiet White and quiet   Cornea Clear Clear   Anterior Chamber Deep and quiet Deep and quiet   Iris Round and reactive Round and reactive   Lens Centered posterior chamber intraocular lens 2+ Nuclear sclerosis   Anterior Vitreous Normal Normal         Fundus Exam       Right Left   Posterior  Vitreous  Normal   Disc  Normal   C/D Ratio  0.3   Macula  Roughly 1.5 disc area in 2 disc diameter size subfoveal white lesion OS with rim of hemorrhage inferiorly suggesting mature CNVM present for some months, no large bright red bleeding, smaller overall post injection #1 and 2   Vessels  Normal   Periphery  Normal            IMAGING AND PROCEDURES  Imaging and Procedures for 11/16/21  OCT, Retina - OU - Both Eyes       Right Eye Quality was good. Scan locations included subfoveal. Central Foveal Thickness: 283. Progression has no prior data. Findings include retinal drusen .   Left Eye Quality was good. Scan locations included subfoveal. Central Foveal Thickness: 522. Progression has improved. Findings include choroidal neovascular membrane, cystoid macular edema.   Notes OS with massive subfoveal white disciform looking scar well-circumscribed with intimacy of the outer retinal structures into the RPE layer however much less intraretinal fluid and subretinal fluid as compared to onset.  Still active lesion.  We will continue to treat with intravitreal Eylea OS today.       Intravitreal Injection, Pharmacologic Agent - OS - Left Eye       Time Out 11/16/2021. 3:58 PM. Confirmed correct patient, procedure, site, and patient consented.   Anesthesia Topical anesthesia was used. Anesthetic medications included Lidocaine 4%.   Procedure Preparation included 5% betadine to ocular surface, 10% betadine to eyelids, Tobramycin 0.3%. A 30 gauge needle was used.   Injection: 2 mg aflibercept 2 MG/0.05ML   Route: Intravitreal, Site: Left Eye   NDC: [id number], Lot: [id number], Waste: 0 mL   Post-op Post injection exam found visual acuity of at least counting fingers. The patient tolerated the procedure well. There were no complications. The patient received written and verbal post procedure care education. Post injection medications were not given.               ASSESSMENT/PLAN:  Exudative age-related macular degeneration of left eye with active choroidal neovascularization (HCC) OS improved overall post injection #  2 antivegF.  We will repeat antivegF today and use intravitreal Eylea.  Improving symptomatically to the patient.  Nuclear sclerotic cataract of left eye Stable OS.     ICD-10-CM   1. Exudative age-related macular degeneration of left eye with active choroidal neovascularization (HCC)  H35.3221 OCT, Retina - OU - Both Eyes    Intravitreal Injection, Pharmacologic Agent - OS - Left Eye    aflibercept (EYLEA) SOLN 2 mg    2. Nuclear sclerotic cataract of left eye  H25.12       1.  2.  3.  Ophthalmic Meds Ordered this visit:  Meds ordered this encounter  Medications   aflibercept (EYLEA) SOLN 2 mg       Return in about 5 weeks (around 12/21/2021) for dilate, OS, EYLEA OCT.  There are no Patient Instructions on file for this visit.   Explained the diagnoses, plan, and follow up with the patient and they expressed understanding.  Patient expressed understanding of the importance of proper follow up care.   Clent Demark Deray Dawes M.D. Diseases & Surgery of the Retina and Vitreous Retina [facility] Diabetic [place] 11/16/21     Abbreviations: M myopia (nearsighted); A astigmatism; H hyperopia (farsighted); P presbyopia; Mrx spectacle prescription;  CTL contact lenses; OD right eye; OS left eye; OU both eyes  XT exotropia; ET esotropia; PEK punctate epithelial keratitis; PEE punctate epithelial erosions; DES dry eye syndrome; MGD meibomian gland dysfunction; ATs artificial tears; PFAT's preservative free artificial tears; [place] nuclear sclerotic cataract; PSC posterior subcapsular cataract; ERM epi-retinal membrane; PVD posterior vitreous detachment; RD retinal detachment; DM diabetes mellitus; DR diabetic retinopathy; NPDR non-proliferative diabetic retinopathy; PDR proliferative diabetic retinopathy; CSME clinically significant macular  edema; DME diabetic macular edema; dbh dot blot hemorrhages; CWS cotton wool spot; POAG primary open angle glaucoma; C/D cup-to-disc ratio; HVF humphrey visual field; GVF goldmann visual field; OCT optical coherence tomography; IOP intraocular pressure; BRVO Branch retinal vein occlusion; CRVO central retinal vein occlusion; CRAO central retinal artery occlusion; BRAO branch retinal artery occlusion; RT retinal tear; SB scleral buckle; PPV pars plana vitrectomy; VH Vitreous hemorrhage; PRP panretinal laser photocoagulation; IVK intravitreal kenalog; VMT vitreomacular traction; MH Macular hole;  NVD neovascularization of the disc; NVE neovascularization elsewhere; AREDS age related eye disease study; ARMD age related macular degeneration; POAG primary open angle glaucoma; EBMD epithelial/anterior basement membrane dystrophy; ACIOL anterior chamber intraocular lens; IOL intraocular lens; PCIOL posterior chamber intraocular lens; Phaco/IOL phacoemulsification with intraocular lens placement; [place] photorefractive keratectomy; LASIK laser assisted in situ keratomileusis; HTN hypertension; DM diabetes mellitus; COPD chronic obstructive pulmonary disease

## 2021-11-16 NOTE — Assessment & Plan Note (Signed)
Stable OS 

## 2021-11-16 NOTE — Assessment & Plan Note (Signed)
OS improved overall post injection #2 antivegF.  We will repeat antivegF today and use intravitreal Eylea.  Improving symptomatically to the patient.

## 2021-11-18 ENCOUNTER — Other Ambulatory Visit: Payer: Self-pay

## 2021-11-18 ENCOUNTER — Other Ambulatory Visit: Payer: Self-pay | Admitting: Adult Health

## 2021-11-18 DIAGNOSIS — E782 Mixed hyperlipidemia: Secondary | ICD-10-CM

## 2021-11-18 MED ORDER — ROSUVASTATIN CALCIUM 20 MG PO TABS: 20.0000 mg | ORAL_TABLET | Freq: Every day | ORAL | 3 refills | Status: DC

## 2021-11-18 NOTE — Addendum Note (Signed)
Addended by: Gwenyth Ober R on: 11/18/2021 04:55 PM   Modules accepted: Orders

## 2021-11-19 ENCOUNTER — Encounter: Payer: Self-pay | Admitting: Internal Medicine

## 2021-12-21 ENCOUNTER — Ambulatory Visit (INDEPENDENT_AMBULATORY_CARE_PROVIDER_SITE_OTHER): Payer: Medicare Other | Admitting: Ophthalmology

## 2021-12-21 ENCOUNTER — Encounter (INDEPENDENT_AMBULATORY_CARE_PROVIDER_SITE_OTHER): Payer: Medicare Other | Admitting: Ophthalmology

## 2021-12-21 ENCOUNTER — Encounter (INDEPENDENT_AMBULATORY_CARE_PROVIDER_SITE_OTHER): Payer: Self-pay | Admitting: Ophthalmology

## 2021-12-21 DIAGNOSIS — H353131 Nonexudative age-related macular degeneration, bilateral, early dry stage: Secondary | ICD-10-CM

## 2021-12-21 DIAGNOSIS — H353221 Exudative age-related macular degeneration, left eye, with active choroidal neovascularization: Secondary | ICD-10-CM

## 2021-12-21 DIAGNOSIS — H35352 Cystoid macular degeneration, left eye: Secondary | ICD-10-CM

## 2021-12-21 MED ORDER — AFLIBERCEPT 2MG/0.05ML IZ SOLN FOR KALEIDOSCOPE
2.0000 mg | INTRAVITREAL | Status: AC | PRN
  Administered 2021-12-21: 2 mg via INTRAVITREAL

## 2021-12-21 NOTE — Assessment & Plan Note (Signed)
No sign of CNVM OD by OCT

## 2021-12-21 NOTE — Assessment & Plan Note (Signed)
Improved with treatment of wet AMD

## 2021-12-21 NOTE — Assessment & Plan Note (Signed)
OS, subfoveal white disciform scar now at 5 weeks post most recent Eylea.  Vision limited by the subretinal fibrosis and location.  Much less intraretinal fluid however.  Repeat injection today and will reevaluate next in 8 weeks.

## 2021-12-21 NOTE — Progress Notes (Signed)
12/21/2021     CHIEF COMPLAINT Patient presents for  Chief Complaint  Patient presents with   Macular Degeneration      HISTORY OF PRESENT ILLNESS: John Holland is a 71 y.o. male who presents to the clinic today for:   HPI   5 weeks dilate OS, Eylea OCT Pt states his vision has been stable and improving since injection Pt denies any new floaters or FOL  Last edited by Morene Rankins, CMA on 12/21/2021  1:53 PM.      Referring physician: Dorothyann Peng, NP [place],  [place] [id number]  HISTORICAL INFORMATION:   Selected notes from the [facility]: No current outpatient medications on file. (Ophthalmic Drugs)   No current facility-administered medications for this visit. (Ophthalmic Drugs)   Current Outpatient Medications (Other)  Medication Sig   diazepam (VALIUM) 5 MG tablet TAKE 1 TABLET(5 MG) BY MOUTH EVERY 6 HOURS AS NEEDED FOR ANXIETY   lisinopril (ZESTRIL) 10 MG tablet TAKE 1 TABLET(10 MG) BY MOUTH DAILY   mirtazapine (REMERON) 15 MG tablet TAKE 1 TABLET(15 MG) BY MOUTH AT BEDTIME   mupirocin ointment (BACTROBAN) 2 % Apply 1 application topically 2 (two) times daily.   nystatin cream (MYCOSTATIN) Apply 1 application topically 2 (two) times daily.   rosuvastatin (CRESTOR) 20 MG tablet Take 1 tablet (20 mg total) by mouth daily.   No current facility-administered medications for this visit. (Other)      REVIEW OF SYSTEMS: ROS   Negative for: Constitutional, Gastrointestinal, Neurological, Skin, Genitourinary, Musculoskeletal, HENT, Endocrine, Cardiovascular, Eyes, Respiratory, Psychiatric, Allergic/Imm, Heme/Lymph Last edited by Orene Desanctis D, CMA on 12/21/2021  1:53 PM.       ALLERGIES Allergies  Allergen Reactions   Codeine Phosphate Nausea And Vomiting    PAST MEDICAL HISTORY Past Medical History:  Diagnosis Date   Anxiety    Arthritis    Bipolar 1 disorder ([place])    Depression     Hepatitis C 12/05/2015   Hyperlipidemia    Macular degeneration of left eye    Past Surgical History:  Procedure Laterality Date   ANKLE FRACTURE SURGERY     age 23-lt   arthroscopy arm  2003   right elbow bone frag   COLONOSCOPY     ROTATOR CUFF REPAIR Right 2015?   ROTATOR CUFF REPAIR Left 04/2020    FAMILY HISTORY Family History  Problem Relation Age of Onset   COPD Mother    Heart disease Mother    Colon cancer Neg Hx    Stomach cancer Neg Hx     SOCIAL HISTORY Social History   Tobacco Use   Smoking status: Former    Types: Cigarettes    Quit date: 03/02/2011    Years since quitting: 10.8   Smokeless tobacco: Never  Substance Use Topics   Alcohol use: No   Drug use: No         OPHTHALMIC EXAM:  Base Eye Exam     Visual Acuity (ETDRS)       Right Left   Dist [place] 20/20 CF at 3'         Tonometry (Tonopen, 2:00 PM)       Right Left   Pressure 12 14         Pupils       Pupils APD   Right PERRL None   Left PERRL None  Visual Fields       Left Right   Restrictions Partial inner superior temporal, inferior temporal, superior nasal, inferior nasal deficiencies          Neuro/Psych     Oriented x3: Yes   Mood/Affect: Normal         Dilation     Left eye: 2.5% Phenylephrine, 1.0% Mydriacyl @ 1:54 PM           Slit Lamp and Fundus Exam     External Exam       Right Left   External Normal Normal         Slit Lamp Exam       Right Left   Lids/Lashes Normal Normal   Conjunctiva/Sclera White and quiet White and quiet   Cornea Clear Clear   Anterior Chamber Deep and quiet Deep and quiet   Iris Round and reactive Round and reactive   Lens Centered posterior chamber intraocular lens 2+ Nuclear sclerosis   Anterior Vitreous Normal Normal         Fundus Exam       Right Left   Posterior Vitreous  Normal   Disc  Normal   C/D Ratio  0.3   Macula  Roughly 1.5 disc area in 2 disc diameter size subfoveal  white lesion OS with rim of hemorrhage inferiorly suggesting mature CNVM present for some months, no large bright red bleeding, smaller overall post injection #1 and 2   Vessels  Normal   Periphery  Normal            IMAGING AND PROCEDURES  Imaging and Procedures for 12/21/21  OCT, Retina - OU - Both Eyes       Right Eye Quality was good. Scan locations included subfoveal. Central Foveal Thickness: 281. Progression has been stable. Findings include retinal drusen .   Left Eye Quality was good. Scan locations included subfoveal. Central Foveal Thickness: 383. Progression has improved. Findings include choroidal neovascular membrane, cystoid macular edema.   Notes OS with massive subfoveal white disciform looking scar well-circumscribed with intimacy of the outer retinal structures into the RPE layer however much less intraretinal fluid and subretinal fluid as compared to onset.  Still active lesion.  Overall improved we will continue to treat with intravitreal Eylea OS today.       Intravitreal Injection, Pharmacologic Agent - OS - Left Eye       Time Out 12/21/2021. 2:20 PM. Confirmed correct patient, procedure, site, and patient consented.   Anesthesia Topical anesthesia was used. Anesthetic medications included Lidocaine 4%.   Procedure Preparation included 5% betadine to ocular surface, 10% betadine to eyelids, Tobramycin 0.3%. A 30 gauge needle was used.   Injection: 2 mg aflibercept 2 MG/0.05ML   Route: Intravitreal, Site: Left Eye   NDC: [id number], Lot: [id number], Expiration date: 06/07/2022, Waste: 0 mL   Post-op Post injection exam found visual acuity of at least counting fingers. The patient tolerated the procedure well. There were no complications. The patient received written and verbal post procedure care education. Post injection medications were not given.              ASSESSMENT/PLAN:  Exudative age-related macular degeneration of left eye  with active choroidal neovascularization (HCC) OS, subfoveal white disciform scar now at 5 weeks post most recent Eylea.  Vision limited by the subretinal fibrosis and location.  Much less intraretinal fluid however.  Repeat injection today and will reevaluate next in 8 weeks.  Early  stage nonexudative age-related macular degeneration of both eyes No sign of CNVM OD by OCT  Cystoid macular edema of left eye Improved with treatment of wet AMD      ICD-10-CM   1. Exudative age-related macular degeneration of left eye with active choroidal neovascularization (HCC)  H35.3221 OCT, Retina - OU - Both Eyes    Intravitreal Injection, Pharmacologic Agent - OS - Left Eye    aflibercept (EYLEA) SOLN 2 mg    2. Early stage nonexudative age-related macular degeneration of both eyes  H35.3131     3. Cystoid macular edema of left eye  H35.352       1.  OD continue to observe  2.  OS subfoveal CNVM, disciform scar, vision limited by subfoveal scarring and RPE change and photoreceptor layer change.  3.  Ophthalmic Meds Ordered this visit:  Meds ordered this encounter  Medications   aflibercept (EYLEA) SOLN 2 mg       Return in about 8 weeks (around 02/15/2022) for dilate, OS, EYLEA OCT.  There are no Patient Instructions on file for this visit.   Explained the diagnoses, plan, and follow up with the patient and they expressed understanding.  Patient expressed understanding of the importance of proper follow up care.   Clent Demark Dawana Asper M.D. Diseases & Surgery of the Retina and Vitreous [facility] 12/21/21     Abbreviations: M myopia (nearsighted); A astigmatism; H hyperopia (farsighted); P presbyopia; Mrx spectacle prescription;  CTL contact lenses; OD right eye; OS left eye; OU both eyes  XT exotropia; ET esotropia; PEK punctate epithelial keratitis; PEE punctate epithelial erosions; DES dry eye syndrome; MGD meibomian gland dysfunction; ATs artificial tears; PFAT's  preservative free artificial tears; [place] nuclear sclerotic cataract; PSC posterior subcapsular cataract; ERM epi-retinal membrane; PVD posterior vitreous detachment; RD retinal detachment; DM diabetes mellitus; DR diabetic retinopathy; NPDR non-proliferative diabetic retinopathy; PDR proliferative diabetic retinopathy; CSME clinically significant macular edema; DME diabetic macular edema; dbh dot blot hemorrhages; CWS cotton wool spot; POAG primary open angle glaucoma; C/D cup-to-disc ratio; HVF humphrey visual field; GVF goldmann visual field; OCT optical coherence tomography; IOP intraocular pressure; BRVO Branch retinal vein occlusion; CRVO central retinal vein occlusion; CRAO central retinal artery occlusion; BRAO branch retinal artery occlusion; RT retinal tear; SB scleral buckle; PPV pars plana vitrectomy; VH Vitreous hemorrhage; PRP panretinal laser photocoagulation; IVK intravitreal kenalog; VMT vitreomacular traction; MH Macular hole;  NVD neovascularization of the disc; NVE neovascularization elsewhere; AREDS age related eye disease study; ARMD age related macular degeneration; POAG primary open angle glaucoma; EBMD epithelial/anterior basement membrane dystrophy; ACIOL anterior chamber intraocular lens; IOL intraocular lens; PCIOL posterior chamber intraocular lens; Phaco/IOL phacoemulsification with intraocular lens placement; [place] photorefractive keratectomy; LASIK laser assisted in situ keratomileusis; HTN hypertension; DM diabetes mellitus; COPD chronic obstructive pulmonary disease

## 2022-01-13 IMAGING — DX DG CERVICAL SPINE COMPLETE 4+V
5 series · 5 of 5 positions shown · non-contrast
Comparison: None.

CLINICAL DATA: Cervical radiculopathy. No reported injury.

EXAM:
CERVICAL SPINE - COMPLETE 4+ VIEW

[cervical spine lat]
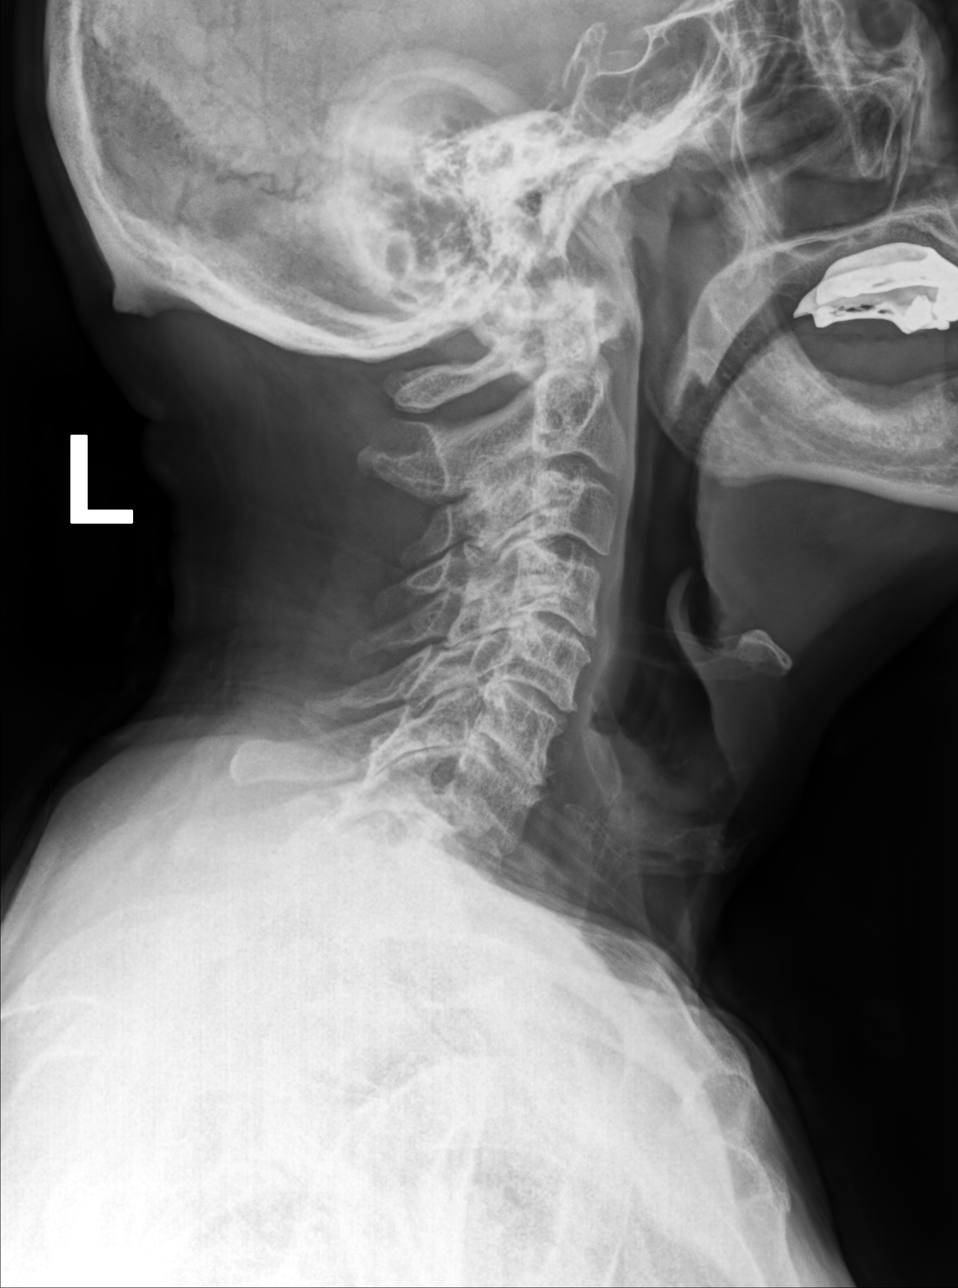

[cervical spine oblique (1 of 2)]
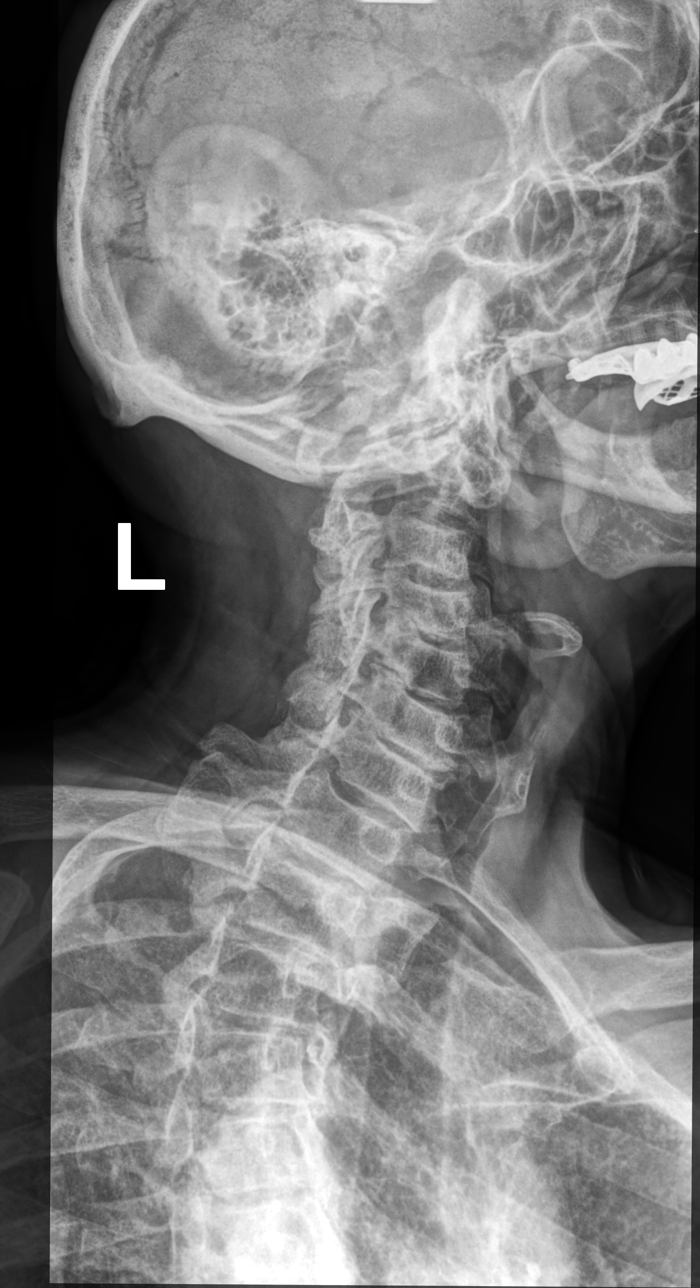

[cervical spine oblique (2 of 2)]
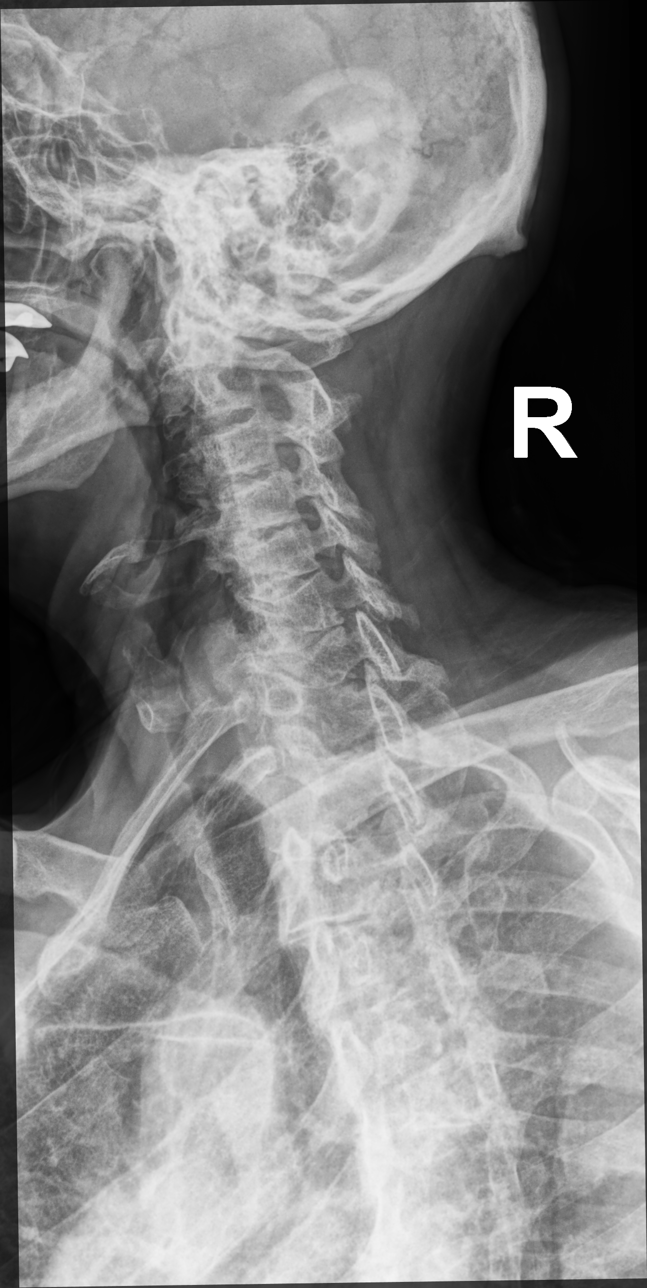

[cervical spine ap]
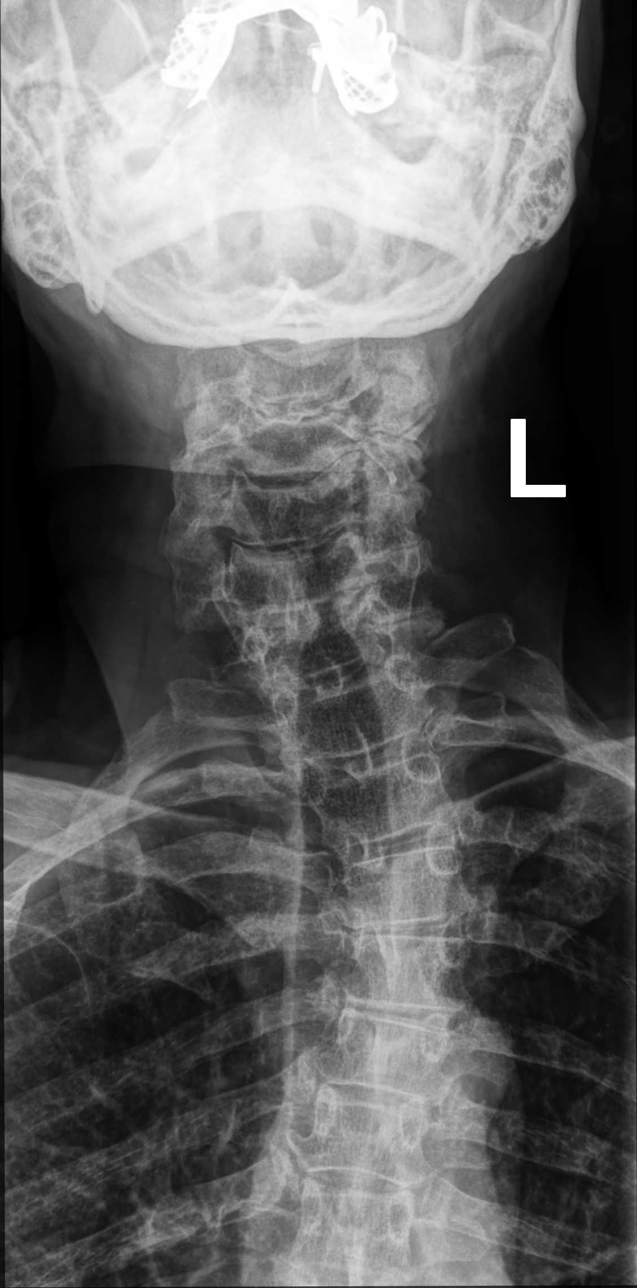

[cervical spine open mouth ap]
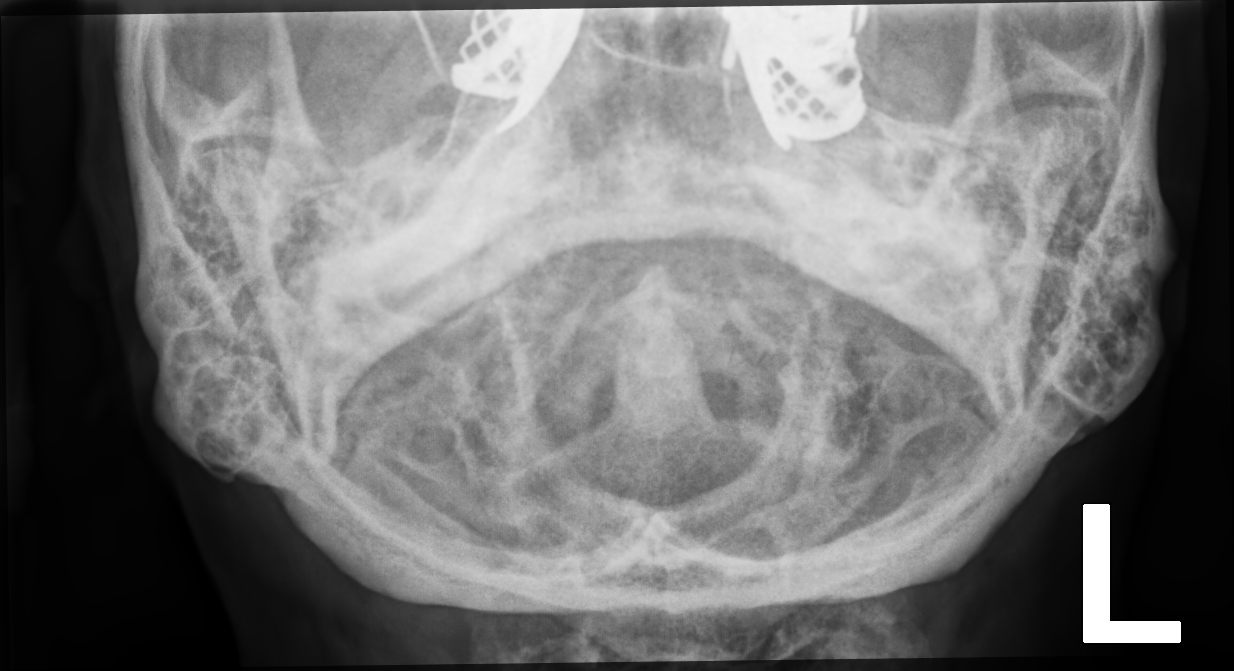

[5 of 5 positions shown; findings below may reference images not displayed]

FINDINGS: On the lateral view the cervical spine is visualized to the level of
C7-T1. Straightening of the cervical spine. Moderate long segment
levocurvature of the cervical and upper thoracic spine.
Pre-vertebral soft tissues are within normal limits. No fracture is
detected in the cervical spine. Dens is well positioned between the
lateral masses of C1. Moderate to severe multilevel degenerative
disc disease in the cervical spine, most prominent at C6-7. No
subluxation. Moderate bilateral facet arthropathy. Mild-to-moderate
degenerative foraminal stenosis on the left at C3-4, C4-5, C5-6 and
C6-7. No significant degenerative foraminal stenosis on the right.
No aggressive-appearing focal osseous lesions.
IMPRESSION: 1. Moderate to severe multilevel degenerative disc disease in the
cervical spine, most prominent at C6-7.
2. Mild-to-moderate multilevel degenerative foraminal stenosis on
the left as described.

## 2022-02-12 ENCOUNTER — Emergency Department (HOSPITAL_COMMUNITY)
Admission: EM | Admit: 2022-02-12 | Discharge: 2022-02-12 | Disposition: A | Discharge status: Home / Self Care | Payer: Medicare Other | Attending: Emergency Medicine | Admitting: Emergency Medicine

## 2022-02-12 ENCOUNTER — Other Ambulatory Visit: Payer: Self-pay

## 2022-02-12 ENCOUNTER — Encounter (HOSPITAL_COMMUNITY): Payer: Self-pay

## 2022-02-12 ENCOUNTER — Emergency Department (HOSPITAL_COMMUNITY): Payer: Medicare Other

## 2022-02-12 DIAGNOSIS — R072 Precordial pain: Secondary | ICD-10-CM | POA: Insufficient documentation

## 2022-02-12 DIAGNOSIS — R0789 Other chest pain: Secondary | ICD-10-CM | POA: Diagnosis not present

## 2022-02-12 DIAGNOSIS — F17200 Nicotine dependence, unspecified, uncomplicated: Secondary | ICD-10-CM | POA: Insufficient documentation

## 2022-02-12 LAB — BASIC METABOLIC PANEL
Anion gap: 8 (ref 5–15)
BUN: 17 mg/dL (ref 8–23)
CO2: 24 mmol/L (ref 22–32)
Calcium: 10 mg/dL (ref 8.9–10.3)
Chloride: 109 mmol/L (ref 98–111)
Creatinine, Ser: 1.3 mg/dL — ABNORMAL HIGH (ref 0.61–1.24)
GFR, Estimated: 59 mL/min — ABNORMAL LOW (ref >60)
Glucose, Bld: 103 mg/dL — ABNORMAL HIGH (ref 70–99)
Potassium: 4.4 mmol/L (ref 3.5–5.1)
Sodium: 141 mmol/L (ref 135–145)

## 2022-02-12 LAB — CBC
HCT: 45.9 % (ref 39.0–52.0)
Hemoglobin: 15.5 g/dL (ref 13.0–17.0)
MCH: 31.8 pg (ref 26.0–34.0)
MCHC: 33.8 g/dL (ref 30.0–36.0)
MCV: 94.1 fL (ref 80.0–100.0)
Platelets: 240 10*3/uL (ref 150–400)
RBC: 4.88 MIL/uL (ref 4.22–5.81)
RDW: 12 % (ref 11.5–15.5)
WBC: 9.7 10*3/uL (ref 4.0–10.5)
nRBC: 0 % (ref 0.0–0.2)

## 2022-02-12 LAB — TROPONIN I (HIGH SENSITIVITY)
Troponin I (High Sensitivity): 5 ng/L (ref <18)
Troponin I (High Sensitivity): 6 ng/L (ref <18)

## 2022-02-12 LAB — D-DIMER, QUANTITATIVE: D-Dimer, Quant: 0.6 ug/mL-FEU — ABNORMAL HIGH (ref 0.00–0.50)

## 2022-02-12 MED ORDER — SODIUM CHLORIDE 0.9 % IV BOLUS
500.0000 mL | Freq: Once | INTRAVENOUS | Status: AC
  Administered 2022-02-12: 500 mL via INTRAVENOUS

## 2022-02-12 NOTE — ED Triage Notes (Signed)
Pt reports L sided chest tightness earlier today that has since subsided. Denies abdominal pain, back pain, and N/V/D.

## 2022-02-12 NOTE — Discharge Instructions (Signed)

## 2022-02-12 NOTE — ED Provider Notes (Signed)
Emergency Department Provider Note   I have reviewed the triage vital signs and the nursing notes.   HISTORY  Chief Complaint Chest Pain   HPI John Holland is a 71 y.o. male with past medical history reviewed below including depression and hyperlipidemia presents to the emergency department for evaluation of chest discomfort.  Patient states he had around 1 hour of left-sided chest discomfort which she describes as "cramping."  Denies any clear provoking factors.  No radiation of symptoms.  He is not feeling short of breath.  No exertional component to pain.  No diaphoresis, nausea, vomiting.  No similar pain to this in the past.  He states it was strong enough to where he brought him to the hospital.  He is no longer having any discomfort. No known history of ACS. He does smoke.    Past Medical History:  Diagnosis Date   Anxiety    Arthritis    Bipolar 1 disorder ([place])    Depression    Hepatitis C 12/05/2015   Hyperlipidemia    Macular degeneration of left eye     Review of Systems  Constitutional: No fever/chills Eyes: No visual changes. ENT: No sore throat. Cardiovascular: Positive chest pain. Respiratory: Denies shortness of breath. Gastrointestinal: No abdominal pain.  No nausea, no vomiting.  No diarrhea.  No constipation. Genitourinary: Negative for dysuria. Musculoskeletal: Negative for back pain. Skin: Negative for rash. Neurological: Negative for headaches, focal weakness or numbness.  ____________________________________________   PHYSICAL EXAM:  VITAL SIGNS: ED Triage Vitals  Enc Vitals Group     BP 02/12/22 1436 (!) 146/88     Pulse Rate 02/12/22 1436 (!) 115     Resp 02/12/22 1436 19     Temp 02/12/22 1436 98.1 F (36.7 C)     Temp Source 02/12/22 1436 Oral     SpO2 02/12/22 1436 95 %     Weight 02/12/22 1437 145 lb (65.8 kg)     Height 02/12/22 1437 '5\' 8"'$  (1.727 m)   Constitutional: Alert and oriented. Well appearing and in no acute  distress. Eyes: Conjunctivae are normal.  Head: Atraumatic. Nose: No congestion/rhinnorhea. Mouth/Throat: Mucous membranes are moist.  Neck: No stridor.  Cardiovascular: Normal rate, regular rhythm. Good peripheral circulation. Grossly normal heart sounds.   Respiratory: Normal respiratory effort.  No retractions. Lungs CTAB. Gastrointestinal: Soft and nontender. No distention.  Musculoskeletal: No lower extremity tenderness nor edema. No gross deformities of extremities. Neurologic:  Normal speech and language. No gross focal neurologic deficits are appreciated.  Skin:  Skin is warm, dry and intact. No rash noted.  ____________________________________________   LABS (all labs ordered are listed, but only abnormal results are displayed)  Labs Reviewed  BASIC METABOLIC PANEL - Abnormal; Notable for the following components:      Result Value   Glucose, Bld 103 (*)    Creatinine, Ser 1.30 (*)    GFR, Estimated 59 (*)    All other components within normal limits  D-DIMER, QUANTITATIVE - Abnormal; Notable for the following components:   D-Dimer, Quant 0.60 (*)    All other components within normal limits  CBC  TROPONIN I (HIGH SENSITIVITY)  TROPONIN I (HIGH SENSITIVITY)   ____________________________________________  EKG   EKG Interpretation  Date/Time:  Friday February 12 2022 14:36:33 EDT Ventricular Rate:  111 PR Interval:  130 QRS Duration: 75 QT Interval:  304 QTC Calculation: 413 R Axis:   88 Text Interpretation: Sinus tachycardia Multiform ventricular premature complexes Aberrant  complex Borderline right axis deviation Minimal ST depression, inferior leads Confirmed by Nanda Quinton [phone]) on 02/12/2022 4:17:58 PM        ____________________________________________  RADIOLOGY  DG Chest 2 View  Result Date: 02/12/2022 CLINICAL DATA:  Chest pain, LEFT-sided chest tightness. EXAM: CHEST - 2 VIEW COMPARISON:  Chest x-ray dated 04/23/2004. FINDINGS: Heart size and  mediastinal contours are within normal limits. Lungs are clear. No pleural effusion or pneumothorax is seen. Osseous structures about the chest are unremarkable. IMPRESSION: No active cardiopulmonary disease. No evidence of pneumonia or pulmonary edema. Electronically Signed   By: Franki Cabot M.D.   On: 02/12/2022 16:11    ____________________________________________   PROCEDURES  Procedure(s) performed:   Procedures  None  ____________________________________________   INITIAL IMPRESSION / ASSESSMENT AND PLAN / ED COURSE  Pertinent labs & imaging results that were available during my care of the patient were reviewed by me and considered in my medical decision making (see chart for details).   This patient is Presenting for Evaluation of CP, which does require a range of treatment options, and is a complaint that involves a high risk of morbidity and mortality.  The Differential Diagnoses includes all life-threatening causes for chest pain. This includes but is not exclusive to acute coronary syndrome, aortic dissection, pulmonary embolism, cardiac tamponade, community-acquired pneumonia, pericarditis, musculoskeletal chest wall pain, etc.   Critical Interventions-    Medications  sodium chloride 0.9 % bolus 500 mL (500 mLs Intravenous New Bag/Given 02/12/22 1713)    Reassessment after intervention: HR improved.   I decided to review pertinent External Data, and in summary no prior left heart cath data for review.   Clinical Laboratory Tests Ordered, included troponin negative x2.  D-dimer 1.6 but age-adjusted within normal limits.  No acute kidney injury.  Radiologic Tests Ordered, included CXR. I independently interpreted the images and agree with radiology interpretation.   Cardiac Monitor Tracing which shows NSR.   Social Determinants of Health Risk positive smoker.    Medical Decision Making: Summary:  Patient with episode of left chest tightness earlier today.  No  active discomfort.  No abdominal pain or tenderness.  He has some risk factors for ACS including age, smoking, hypertension, hyperlipidemia.  EKG with no acute ischemic finding.  Initial troponin is pending.  Does have some tachycardia on arrival but has resolved on recheck to the back/acute care area.  I will add on a D-dimer in this setting.  Reevaluation with update and discussion with patient.  He is feeling well.  Vital signs have normalized.  Will place outpatient referral to cardiology.  Discussed strict ED return precautions.   Disposition: discharge  ____________________________________________  FINAL CLINICAL IMPRESSION(S) / ED DIAGNOSES  Final diagnoses:  Precordial chest pain     Note:  This document was prepared using Dragon voice recognition software and may include unintentional dictation errors.  Nanda Quinton, MD, [facility] Emergency Medicine    Eesa Justiss, [facility], MD 02/12/22 Kathyrn Drown

## 2022-02-12 NOTE — ED Provider Triage Note (Signed)
Emergency Medicine Provider Triage Evaluation Note  EDWARDO WOJNAROWSKI , a 71 y.o. male  was evaluated in triage.  Pt complains of 1 month of intermittent CP L sided and non radiating. Seems to come on w certain movements and resolve with "walking it off."  No trauma  Review of Systems  Positive: CP Negative: SOB, nausea  Physical Exam  BP (!) 146/88 (BP Location: Right Arm)   Pulse (!) 115   Temp 98.1 F (36.7 C) (Oral)   Resp 19   Ht '5\' 8"'$  (1.727 m)   Wt 65.8 kg   SpO2 95%   BMI 22.05 kg/m  Gen:   Awake, no distress   Resp:  Normal effort  MSK:   Moves extremities without difficulty  Other:  No leg edema. HR is 88and regular  Medical Decision Making  Medically screening exam initiated at 3:21 PM.  Appropriate orders placed.  ESIQUIO BOESEN was informed that the remainder of the evaluation will be completed by another provider, this initial triage assessment does not replace that evaluation, and the importance of remaining in the ED until their evaluation is complete.  CP workup   Tedd Sias, [place] 02/12/22 [id number]

## 2022-02-15 ENCOUNTER — Encounter (INDEPENDENT_AMBULATORY_CARE_PROVIDER_SITE_OTHER): Payer: Self-pay | Admitting: Ophthalmology

## 2022-02-15 ENCOUNTER — Ambulatory Visit (INDEPENDENT_AMBULATORY_CARE_PROVIDER_SITE_OTHER): Payer: Medicare Other | Admitting: Ophthalmology

## 2022-02-15 DIAGNOSIS — H353221 Exudative age-related macular degeneration, left eye, with active choroidal neovascularization: Secondary | ICD-10-CM | POA: Diagnosis not present

## 2022-02-15 DIAGNOSIS — H2512 Age-related nuclear cataract, left eye: Secondary | ICD-10-CM | POA: Diagnosis not present

## 2022-02-15 MED ORDER — AFLIBERCEPT 2MG/0.05ML IZ SOLN FOR KALEIDOSCOPE
2.0000 mg | INTRAVITREAL | Status: AC | PRN
  Administered 2022-02-15: 2 mg via INTRAVITREAL

## 2022-02-15 NOTE — Assessment & Plan Note (Addendum)
Large subfoveal white disciform scar seen clinically and no active disease progression on OCT.  Acuity limited by location and elevation of the macula OS.  Today follow-up interval of 8 weeks.  We will  repeat injection and follow-up next in 12 weeks

## 2022-02-15 NOTE — Progress Notes (Signed)
02/15/2022     CHIEF COMPLAINT Patient presents for  Chief Complaint  Patient presents with   Macular Degeneration      HISTORY OF PRESENT ILLNESS: John Holland is a 71 y.o. male who presents to the clinic today for:   HPI    WET ARMD OS, IMPROVED symptoms, less distortions 8 weeks dilate os eylea oct Pt states his vision has been stable Pt denies any new floaters or FOL  Last edited by Hurman Horn, MD on 02/15/2022  1:57 PM.      Referring physician: Dorothyann Peng, NP [place],  [place] [address]  HISTORICAL INFORMATION:   Selected notes from the Rule: No current outpatient medications on file. (Ophthalmic Drugs)   No current facility-administered medications for this visit. (Ophthalmic Drugs)   Current Outpatient Medications (Other)  Medication Sig   diazepam (VALIUM) 5 MG tablet TAKE 1 TABLET(5 MG) BY MOUTH EVERY 6 HOURS AS NEEDED FOR ANXIETY   lisinopril (ZESTRIL) 10 MG tablet TAKE 1 TABLET(10 MG) BY MOUTH DAILY   mirtazapine (REMERON) 15 MG tablet TAKE 1 TABLET(15 MG) BY MOUTH AT BEDTIME   mupirocin ointment (BACTROBAN) 2 % Apply 1 application topically 2 (two) times daily.   nystatin cream (MYCOSTATIN) Apply 1 application topically 2 (two) times daily.   rosuvastatin (CRESTOR) 20 MG tablet Take 1 tablet (20 mg total) by mouth daily.   No current facility-administered medications for this visit. (Other)      REVIEW OF SYSTEMS: ROS   Negative for: Constitutional, Gastrointestinal, Neurological, Skin, Genitourinary, Musculoskeletal, HENT, Endocrine, Cardiovascular, Eyes, Respiratory, Psychiatric, Allergic/Imm, Heme/Lymph Last edited by Orene Desanctis D, CMA on 02/15/2022  1:22 PM.       ALLERGIES Allergies  Allergen Reactions   Codeine Phosphate Nausea And Vomiting    PAST MEDICAL HISTORY Past Medical History:  Diagnosis Date   Anxiety    Arthritis    Bipolar 1 disorder  ([place])    Depression    Hepatitis C 12/05/2015   Hyperlipidemia    Macular degeneration of left eye    Past Surgical History:  Procedure Laterality Date   ANKLE FRACTURE SURGERY     age 61-lt   arthroscopy arm  2003   right elbow bone frag   COLONOSCOPY     ROTATOR CUFF REPAIR Right 2015?   ROTATOR CUFF REPAIR Left 04/2020    FAMILY HISTORY Family History  Problem Relation Age of Onset   COPD Mother    Heart disease Mother    Colon cancer Neg Hx    Stomach cancer Neg Hx     SOCIAL HISTORY Social History   Tobacco Use   Smoking status: Former    Types: Cigarettes    Quit date: 03/02/2011    Years since quitting: 10.9   Smokeless tobacco: Never  Substance Use Topics   Alcohol use: No   Drug use: No         OPHTHALMIC EXAM:  Base Eye Exam     Visual Acuity (ETDRS)       Right Left   Dist [place] 20/25 +3 CF at 3'    Correction: Glasses         Tonometry (Tonopen, 1:27 PM)       Right Left   Pressure 9 7         Pupils       Pupils APD   Right PERRL None  Left PERRL          Visual Fields       Left Right     Full   Restrictions Partial inner superior temporal, inferior temporal, superior nasal, inferior nasal deficiencies          Neuro/Psych     Oriented x3: Yes   Mood/Affect: Normal         Dilation     Left eye: 2.5% Phenylephrine, 1.0% Mydriacyl @ 1:24 PM           Slit Lamp and Fundus Exam     External Exam       Right Left   External Normal Normal         Slit Lamp Exam       Right Left   Lids/Lashes Normal Normal   Conjunctiva/Sclera White and quiet White and quiet   Cornea Clear Clear   Anterior Chamber Deep and quiet Deep and quiet   Iris Round and reactive Round and reactive   Lens Centered posterior chamber intraocular lens 2+ Nuclear sclerosis   Anterior Vitreous Normal Normal         Fundus Exam       Right Left   Posterior Vitreous  Normal   Disc  Normal   C/D Ratio  0.3   Macula   Roughly 1.5 disc area in 2 disc diameter size subfoveal white lesion OS with rim of hemorrhage inferiorly suggesting mature CNVM present for some months, no large bright red bleeding, smaller overall post injections   Vessels  Normal   Periphery  Normal            IMAGING AND PROCEDURES  Imaging and Procedures for 02/15/22  OCT, Retina - OU - Both Eyes       Right Eye Quality was good. Scan locations included subfoveal. Central Foveal Thickness: 280. Progression has been stable. Findings include retinal drusen .   Left Eye Quality was good. Scan locations included subfoveal. Central Foveal Thickness: 416. Progression has improved. Findings include choroidal neovascular membrane, cystoid macular edema.   Notes OS with massive subfoveal white disciform looking scar well-circumscribed with intimacy of the outer retinal structures into the RPE layer however much less intraretinal fluid and subretinal fluid as compared to onset.  Still active lesion.  Overall improved we will continue to treat with intravitreal Eylea OS today.        Intravitreal Injection, Pharmacologic Agent - OS - Left Eye       Time Out 02/15/2022. 1:58 PM. Confirmed correct patient, procedure, site, and patient consented.   Anesthesia Topical anesthesia was used. Anesthetic medications included Lidocaine 4%.   Procedure Preparation included 5% betadine to ocular surface, 10% betadine to eyelids, Tobramycin 0.3%. A 30 gauge needle was used.   Injection: 2 mg aflibercept 2 MG/0.05ML   Route: Intravitreal, Site: Left Eye   NDC: [id number], Lot: [id number], Expiration date: 03/08/2023, Waste: 0 mL   Post-op Post injection exam found visual acuity of at least counting fingers. The patient tolerated the procedure well. There were no complications. The patient received written and verbal post procedure care education. Post injection medications were not given.              ASSESSMENT/PLAN:  Nuclear  sclerotic cataract of left eye Okay to proceed with cataract surgery in the left eye with intraocular ends placement to offer refractive balancing should it be required for good vision in the right eye but also peripheral vision  enhancement OS  Exudative age-related macular degeneration of left eye with active choroidal neovascularization (HCC) Large subfoveal white disciform scar seen clinically and no active disease progression on OCT.  Acuity limited by location and elevation of the macula OS.  Today follow-up interval of 8 weeks.  We will  repeat injection and follow-up next in 12 weeks     ICD-10-CM   1. Exudative age-related macular degeneration of left eye with active choroidal neovascularization (HCC)  H35.3221 OCT, Retina - OU - Both Eyes    Intravitreal Injection, Pharmacologic Agent - OS - Left Eye    aflibercept (EYLEA) SOLN 2 mg    2. Nuclear sclerotic cataract of left eye  H25.12       OS doing well.  Symptomatically improved.  Permanent visual acuity changes patient understands due to the location of the original lesion.  2.  OS with cataract, may need cataract surgery simply to balance the 2 eyes refractive [place] I defer to the judgment of Dr. Katy Fitch.  3.  Ophthalmic Meds Ordered this visit:  Meds ordered this encounter  Medications   aflibercept (EYLEA) SOLN 2 mg       Return in about 12 weeks (around 05/10/2022) for dilate, OS, EYLEA OCT.  There are no Patient Instructions on file for this visit.   Explained the diagnoses, plan, and follow up with the patient and they expressed understanding.  Patient expressed understanding of the importance of proper follow up care.   Clent Demark Niklaus Mamaril M.D. Diseases & Surgery of the Retina and Vitreous [facility] 02/15/22     Abbreviations: M myopia (nearsighted); A astigmatism; H hyperopia (farsighted); P presbyopia; Mrx spectacle prescription;  CTL contact lenses; OD right eye; OS left eye; OU both eyes   XT exotropia; ET esotropia; PEK punctate epithelial keratitis; PEE punctate epithelial erosions; DES dry eye syndrome; MGD meibomian gland dysfunction; ATs artificial tears; PFAT's preservative free artificial tears; [place] nuclear sclerotic cataract; PSC posterior subcapsular cataract; ERM epi-retinal membrane; PVD posterior vitreous detachment; RD retinal detachment; DM diabetes mellitus; DR diabetic retinopathy; NPDR non-proliferative diabetic retinopathy; PDR proliferative diabetic retinopathy; CSME clinically significant macular edema; DME diabetic macular edema; dbh dot blot hemorrhages; CWS cotton wool spot; POAG primary open angle glaucoma; C/D cup-to-disc ratio; HVF humphrey visual field; GVF goldmann visual field; OCT optical coherence tomography; IOP intraocular pressure; BRVO Branch retinal vein occlusion; CRVO central retinal vein occlusion; CRAO central retinal artery occlusion; BRAO branch retinal artery occlusion; RT retinal tear; SB scleral buckle; PPV pars plana vitrectomy; VH Vitreous hemorrhage; PRP panretinal laser photocoagulation; IVK intravitreal kenalog; VMT vitreomacular traction; MH Macular hole;  NVD neovascularization of the disc; NVE neovascularization elsewhere; AREDS age related eye disease study; ARMD age related macular degeneration; POAG primary open angle glaucoma; EBMD epithelial/anterior basement membrane dystrophy; ACIOL anterior chamber intraocular lens; IOL intraocular lens; PCIOL posterior chamber intraocular lens; Phaco/IOL phacoemulsification with intraocular lens placement; [place] photorefractive keratectomy; LASIK laser assisted in situ keratomileusis; HTN hypertension; DM diabetes mellitus; COPD chronic obstructive pulmonary disease

## 2022-02-15 NOTE — Assessment & Plan Note (Signed)
Okay to proceed with cataract surgery in the left eye with intraocular ends placement to offer refractive balancing should it be required for good vision in the right eye but also peripheral vision enhancement OS

## 2022-02-17 ENCOUNTER — Encounter: Payer: Self-pay | Admitting: Adult Health

## 2022-02-17 ENCOUNTER — Ambulatory Visit (INDEPENDENT_AMBULATORY_CARE_PROVIDER_SITE_OTHER): Payer: Medicare Other | Admitting: Adult Health

## 2022-02-17 VITALS — BP 110/60 | HR 75 | Temp 98.3°F | Resp 75 | Wt 149.0 lb

## 2022-02-17 DIAGNOSIS — E860 Dehydration: Secondary | ICD-10-CM

## 2022-02-17 DIAGNOSIS — Z23 Encounter for immunization: Secondary | ICD-10-CM

## 2022-02-17 DIAGNOSIS — R0789 Other chest pain: Secondary | ICD-10-CM | POA: Diagnosis not present

## 2022-02-17 NOTE — Progress Notes (Signed)
Subjective:    Patient ID: John Holland, male    DOB: 1951/01/18, 71 y.o.   MRN: [id number]  HPI 71 year old male who  has a past medical history of Anxiety, Arthritis, Bipolar 1 disorder ([place]), Depression, Hepatitis C (12/05/2015), Hyperlipidemia, and Macular degeneration of left eye.  He is being evaluated today for follow-up after emergency room visit 5 days ago.  He presented to the emergency room for evaluation of chest discomfort.  Per ER note he had left-sided chest discomfort for approximately 1 hour before coming to the emergency room.  This pain was described as "cramping".  He denied any provoking factors.  Did not have any radiating pain.  He was not feeling short of breath, had no diaphoresis, nausea, or vomiting.  His work-up in the emergency room showed a normal chest x-ray, EKG which showed normal sinus rhythm, troponin was negative x2, D-dimer 0.6 but normal for age, CBC was normal, BMP showed mild dehydration.  Today he reports that he has not had any similar symptoms, prior to going to the emergency room he was outside mowing his yard and possibly became dehydrated as he was not drinking fluids.  Since then he has been drinking much more water and feels better overall.  He does have an outpatient follow-up with cardiology in 2 weeks.  Review of Systems See HPI   Past Medical History:  Diagnosis Date   Anxiety    Arthritis    Bipolar 1 disorder ([place])    Depression    Hepatitis C 12/05/2015   Hyperlipidemia    Macular degeneration of left eye     Social History   Socioeconomic History   Marital status: Married    Spouse name: Not on file   Number of children: Not on file   Years of education: Not on file   Highest education level: Not on file  Occupational History   Occupation: Material Therapist, sports: [employer]  Tobacco Use   Smoking status: Former    Types: Cigarettes    Quit date: 03/02/2011    Years since quitting: 10.9   Smokeless  tobacco: Never  Substance and Sexual Activity   Alcohol use: No   Drug use: No   Sexual activity: Not on file  Other Topics Concern   Not on file  Social History Narrative   Works in a [place] Strain: [place]  (03/25/2021)   Overall Financial Resource Strain (CARDIA)    Difficulty of Paying Living Expenses: Not hard at all  Food Insecurity: No [place] (03/25/2021)   Hunger Vital Sign    Worried About Running Out of Food in the Last Year: Never true    [place] in the Last Year: Never true  Transportation Needs: No Transportation Needs (03/25/2021)   PRAPARE - Hydrologist (Medical): No    Lack of Transportation (Non-Medical): No  Physical Activity: Insufficiently Active (03/25/2021)   Exercise Vital Sign    Days of Exercise per Week: 3 days    Minutes of Exercise per Session: 30 min  Stress: No Stress Concern Present (03/25/2021)   [place]    Feeling of Stress : Not at all  Social Connections: Moderately Isolated (03/25/2021)   Social Connection and Isolation Panel [NHANES]    Frequency of Communication with Friends  and Family: Twice a week    Frequency of Social Gatherings with Friends and Family: Twice a week    Attends Religious Services: More than 4 times per year    Active Member of [employer] or Organizations: No    Attends Archivist Meetings: Never    Marital Status: Widowed  Intimate Partner Violence: Not At Risk (03/25/2021)   Humiliation, Afraid, Rape, and Kick questionnaire    Fear of Current or Ex-Partner: No    Emotionally Abused: No    Physically Abused: No    Sexually Abused: No    Past Surgical History:  Procedure Laterality Date   ANKLE FRACTURE SURGERY     age 44-lt   arthroscopy arm  2003   right elbow bone frag   COLONOSCOPY     ROTATOR CUFF REPAIR Right 2015?    ROTATOR CUFF REPAIR Left 04/2020    Family History  Problem Relation Age of Onset   COPD Mother    Heart disease Mother    Colon cancer Neg Hx    Stomach cancer Neg Hx     Allergies  Allergen Reactions   Codeine Phosphate Nausea And Vomiting    Current Outpatient Medications on File Prior to Visit  Medication Sig Dispense Refill   diazepam (VALIUM) 5 MG tablet TAKE 1 TABLET(5 MG) BY MOUTH EVERY 6 HOURS AS NEEDED FOR ANXIETY 120 tablet 2   lisinopril (ZESTRIL) 10 MG tablet TAKE 1 TABLET(10 MG) BY MOUTH DAILY 90 tablet 3   mirtazapine (REMERON) 15 MG tablet TAKE 1 TABLET(15 MG) BY MOUTH AT BEDTIME 30 tablet 0   mupirocin ointment (BACTROBAN) 2 % Apply 1 application topically 2 (two) times daily. 22 g 0   nystatin cream (MYCOSTATIN) Apply 1 application topically 2 (two) times daily. 30 g 3   rosuvastatin (CRESTOR) 20 MG tablet Take 1 tablet (20 mg total) by mouth daily. 90 tablet 3   No current facility-administered medications on file prior to visit.    BP 110/60   Pulse 75   Temp 98.3 F (36.8 C)   Resp (!) 75   Wt 149 lb (67.6 kg)   SpO2 97%   BMI 22.66 kg/m       Objective:   Physical Exam Vitals and nursing note reviewed.  Constitutional:      Appearance: Normal appearance.  Cardiovascular:     Rate and Rhythm: Normal rate and regular rhythm.     Pulses: Normal pulses.     Heart sounds: Normal heart sounds.  Pulmonary:     Effort: Pulmonary effort is normal.     Breath sounds: Normal breath sounds.  Musculoskeletal:        General: Normal range of motion.  Skin:    General: Skin is warm and dry.  Neurological:     General: No focal deficit present.     Mental Status: He is alert and oriented to person, place, and time.  Psychiatric:        Mood and Affect: Mood normal.        Behavior: Behavior normal.        Thought Content: Thought content normal.        Judgment: Judgment normal.       Assessment & Plan:  1. Atypical chest pain - No longer  experiencing symptoms.  - Follow up as needed  2. Need for immunization against influenza  - Flu Vaccine QUAD High Dose(Fluad)  3. Dehydration - continue to drink plenty of  fluids during the day especially if working outside   Time spent with patient today was 30 minutes which consisted of chart review, discussing chest discomfort and dehydration,  work up, treatment answering questions and documentation.

## 2022-02-23 ENCOUNTER — Encounter (INDEPENDENT_AMBULATORY_CARE_PROVIDER_SITE_OTHER): Payer: Self-pay

## 2022-02-23 DIAGNOSIS — H40013 Open angle with borderline findings, low risk, bilateral: Secondary | ICD-10-CM | POA: Diagnosis not present

## 2022-02-23 DIAGNOSIS — Z961 Presence of intraocular lens: Secondary | ICD-10-CM | POA: Diagnosis not present

## 2022-02-23 DIAGNOSIS — H2512 Age-related nuclear cataract, left eye: Secondary | ICD-10-CM | POA: Diagnosis not present

## 2022-02-23 DIAGNOSIS — H3562 Retinal hemorrhage, left eye: Secondary | ICD-10-CM | POA: Diagnosis not present

## 2022-02-23 DIAGNOSIS — H3581 Retinal edema: Secondary | ICD-10-CM | POA: Diagnosis not present

## 2022-03-03 ENCOUNTER — Encounter: Payer: Self-pay | Admitting: Internal Medicine

## 2022-03-03 ENCOUNTER — Ambulatory Visit: Payer: Medicare Other | Attending: Internal Medicine | Admitting: Internal Medicine

## 2022-03-03 VITALS — BP 136/70 | HR 73 | Ht 68.0 in | Wt 149.8 lb

## 2022-03-03 DIAGNOSIS — R079 Chest pain, unspecified: Secondary | ICD-10-CM | POA: Insufficient documentation

## 2022-03-03 NOTE — Progress Notes (Signed)
Cardiology Office Note:    Date:  03/03/2022   ID:  John Holland, DOB [birth year], MRN [id number]  PCP:  Dorothyann Peng, NP   [facility] Cardiologist:  Janina Mayo, MD     Referring MD: Dorothyann Peng, NP   No chief complaint on file. CP  History of Present Illness:    John Holland is a 71 y.o. male with a hx of depression, HLD, bipolar type 1, Hep C,  referral from the ED for chest pain 9/8. EKG sinus tach. Trop negative. He notes he was dehydrated which brought him to the ED. He was getting cramps on his side. No prior cardiac dx. No prior stress.  Past Medical History:  Diagnosis Date   Anxiety    Arthritis    Bipolar 1 disorder ([place])    Depression    Hepatitis C 12/05/2015   Hyperlipidemia    Macular degeneration of left eye     Past Surgical History:  Procedure Laterality Date   ANKLE FRACTURE SURGERY     age 61-lt   arthroscopy arm  2003   right elbow bone frag   COLONOSCOPY     ROTATOR CUFF REPAIR Right 2015?   ROTATOR CUFF REPAIR Left 04/2020    Current Medications: Current Meds  Medication Sig   diazepam (VALIUM) 5 MG tablet TAKE 1 TABLET(5 MG) BY MOUTH EVERY 6 HOURS AS NEEDED FOR ANXIETY   lisinopril (ZESTRIL) 10 MG tablet TAKE 1 TABLET(10 MG) BY MOUTH DAILY   mirtazapine (REMERON) 7.5 MG tablet Take 7.5 mg by mouth at bedtime.   mupirocin ointment (BACTROBAN) 2 % Apply 1 application topically 2 (two) times daily.   nystatin cream (MYCOSTATIN) Apply 1 application topically 2 (two) times daily.   rosuvastatin (CRESTOR) 20 MG tablet Take 1 tablet (20 mg total) by mouth daily.     Allergies:   Codeine phosphate   Social History   Socioeconomic History   Marital status: Married    Spouse name: Not on file   Number of children: Not on file   Years of education: Not on file   Highest education level: Not on file  Occupational History   Occupation: Associate Professor    Employer: [employer]  Tobacco Use   Smoking  status: Former    Types: Cigarettes    Quit date: 03/02/2011    Years since quitting: 11.0   Smokeless tobacco: Never  Substance and Sexual Activity   Alcohol use: No   Drug use: No   Sexual activity: Not on file  Other Topics Concern   Not on file  Social History Narrative   Works in a [place] Strain: [place]  (03/25/2021)   Overall Financial Resource Strain (CARDIA)    Difficulty of Paying Living Expenses: Not hard at all  Food Insecurity: No [place] (03/25/2021)   Hunger Vital Sign    Worried About Running Out of Food in the Last Year: Never true    [place] in the Last Year: Never true  Transportation Needs: No Transportation Needs (03/25/2021)   PRAPARE - Hydrologist (Medical): No    Lack of Transportation (Non-Medical): No  Physical Activity: Insufficiently Active (03/25/2021)   Exercise Vital Sign    Days of Exercise per Week: 3 days    Minutes of Exercise per Session: 30 min  Stress: No  Stress Concern Present (03/25/2021)   [place]    Feeling of Stress : Not at all  Social Connections: Moderately Isolated (03/25/2021)   Social Connection and Isolation Panel [NHANES]    Frequency of Communication with Friends and Family: Twice a week    Frequency of Social Gatherings with Friends and Family: Twice a week    Attends Religious Services: More than 4 times per year    Active Member of [employer] or Organizations: No    Attends Archivist Meetings: Never    Marital Status: Widowed     Family History: The patient's family history includes COPD in his mother; Heart disease in his mother. There is no history of Colon cancer or Stomach cancer.  ROS:   Please see the history of present illness.     All other systems reviewed and are negative.  EKGs/Labs/Other Studies Reviewed:     The following studies were reviewed today:    Recent Labs: 11/13/2021: ALT 25; TSH 1.32 02/12/2022: BUN 17; Creatinine, Ser 1.30; Hemoglobin 15.5; Platelets 240; Potassium 4.4; Sodium 141   Recent Lipid Panel    Component Value Date/Time   CHOL 188 11/13/2021 0927   TRIG 194.0 (H) 11/13/2021 0927   HDL 41.40 11/13/2021 0927   CHOLHDL 5 11/13/2021 0927   VLDL 38.8 11/13/2021 0927   LDLCALC 108 (H) 11/13/2021 0927     Risk Assessment/Calculations:     Physical Exam:    VS:   Vitals:   03/03/22 1433  BP: 136/70  Pulse: 73  SpO2: 93%     Wt Readings from Last 3 Encounters:  03/03/22 149 lb 12.8 oz (67.9 kg)  02/17/22 149 lb (67.6 kg)  02/12/22 145 lb (65.8 kg)     GEN:  Well nourished, well developed in no acute distress HEENT: Normal NECK: No JVD; No carotid bruits LYMPHATICS: No lymphadenopathy CARDIAC: RRR, no murmurs, rubs, gallops RESPIRATORY:  Clear to auscultation without rales, wheezing or rhonchi  ABDOMEN: Soft, non-tender, non-distended MUSCULOSKELETAL:  No edema; No deformity  SKIN: Warm and dry NEUROLOGIC:  Alert and oriented x 3 PSYCHIATRIC:  Normal affect   ASSESSMENT:    Non cardiac CP: resolved. Negative cardiac work up.   PLAN:    In order of problems listed above:  No further cardiac w/u            Medication Adjustments/Labs and Tests Ordered: Current medicines are reviewed at length with the patient today.  Concerns regarding medicines are outlined above.  No orders of the defined types were placed in this encounter.  No orders of the defined types were placed in this encounter.   Patient Instructions  Medication Instructions:  Your physician recommends that you continue on your current medications as directed. Please refer to the Current Medication list given to you today.  *If you need a refill on your cardiac medications before your next appointment, please call your pharmacy*   Lab  Work: None   Testing/Procedures: None   Follow-Up: At [facility], you and your health needs are our priority.  As part of our continuing mission to provide you with exceptional heart care, we have created designated Provider Care Teams.  These Care Teams include your primary Cardiologist (physician) and Advanced Practice Providers (APPs -  Physician Assistants and Nurse Practitioners) who all work together to provide you with the care you need, when you need it.  We recommend signing up for the patient  portal called "MyChart".  Sign up information is provided on this After Visit Summary.  MyChart is used to connect with patients for Virtual Visits (Telemedicine).  Patients are able to view lab/test results, encounter notes, upcoming appointments, etc.  Non-urgent messages can be sent to your provider as well.   To learn more about what you can do with MyChart, go to [email or web address].    Your next appointment:   As needed  The format for your next appointment:   In Person  Provider:   Janina Mayo, MD     Other Instructions   Important Information About Sugar         Signed, Janina Mayo, MD  03/03/2022 2:41 PM    [place]

## 2022-03-03 NOTE — Patient Instructions (Signed)
Medication Instructions:  Your physician recommends that you continue on your current medications as directed. Please refer to the Current Medication list given to you today.  *If you need a refill on your cardiac medications before your next appointment, please call your pharmacy*   Lab Work: None   Testing/Procedures: None   Follow-Up: At [facility], you and your health needs are our priority.  As part of our continuing mission to provide you with exceptional heart care, we have created designated Provider Care Teams.  These Care Teams include your primary Cardiologist (physician) and Advanced Practice Providers (APPs -  Physician Assistants and Nurse Practitioners) who all work together to provide you with the care you need, when you need it.  We recommend signing up for the patient portal called "MyChart".  Sign up information is provided on this After Visit Summary.  MyChart is used to connect with patients for Virtual Visits (Telemedicine).  Patients are able to view lab/test results, encounter notes, upcoming appointments, etc.  Non-urgent messages can be sent to your provider as well.   To learn more about what you can do with MyChart, go to [email or web address].    Your next appointment:   As needed  The format for your next appointment:   In Person  Provider:   Branch, Mary E, MD     Other Instructions   Important Information About Sugar       

## 2022-03-26 ENCOUNTER — Ambulatory Visit (INDEPENDENT_AMBULATORY_CARE_PROVIDER_SITE_OTHER): Payer: Medicare Other

## 2022-03-26 VITALS — Ht 68.0 in | Wt 149.0 lb

## 2022-03-26 DIAGNOSIS — Z0000 Encounter for general adult medical examination without abnormal findings: Secondary | ICD-10-CM

## 2022-03-26 NOTE — Progress Notes (Signed)
Subjective:   FERNIE GRIMM is a 71 y.o. male who presents for Medicare Annual/Subsequent preventive examination.  Review of Systems    Virtual Visit via Telephone Note  I connected with  Anselmo Rod on 03/26/22 at 12:30 PM EDT by telephone and verified that I am speaking with the correct person using two identifiers.  Location: Patient: Home Provider: Office Persons participating in the virtual visit: patient/Nurse Health Advisor   I discussed the limitations, risks, security and privacy concerns of performing an evaluation and management service by telephone and the availability of in person appointments. The patient expressed understanding and agreed to proceed.  Interactive audio and video telecommunications were attempted between this nurse and patient, however failed, due to patient having technical difficulties OR patient did not have access to video capability.  We continued and completed visit with audio only.  Some vital signs may be absent or patient reported.   Criselda Peaches, LPN  Cardiac Risk Factors include: advanced age (>85mn, >>75women);hypertension;male gender     Objective:    Today's Vitals   03/26/22 1239  Weight: 149 lb (67.6 kg)  Height: '5\' 8"'$  (1.727 m)   Body mass index is 22.66 kg/m.     03/26/2022   12:47 PM 02/12/2022    2:42 PM 03/25/2021   11:29 AM 03/11/2020    2:07 PM 11/05/2011   11:39 AM  Advanced Directives  Does Patient Have a Medical Advance Directive? No No No No Patient does not have advance directive  Would patient like information on creating a medical advance directive? No - Patient declined No - Patient declined No - Patient declined No - Patient declined     Current Medications (verified) Outpatient Encounter Medications as of 03/26/2022  Medication Sig   diazepam (VALIUM) 5 MG tablet TAKE 1 TABLET(5 MG) BY MOUTH EVERY 6 HOURS AS NEEDED FOR ANXIETY   lisinopril (ZESTRIL) 10 MG tablet TAKE 1 TABLET(10 MG) BY MOUTH  DAILY   mirtazapine (REMERON) 7.5 MG tablet Take 7.5 mg by mouth at bedtime.   mupirocin ointment (BACTROBAN) 2 % Apply 1 application topically 2 (two) times daily.   nystatin cream (MYCOSTATIN) Apply 1 application topically 2 (two) times daily.   rosuvastatin (CRESTOR) 20 MG tablet Take 1 tablet (20 mg total) by mouth daily.   No facility-administered encounter medications on file as of 03/26/2022.    Allergies (verified) Codeine phosphate   History: Past Medical History:  Diagnosis Date   Anxiety    Arthritis    Bipolar 1 disorder ([place]    Depression    Hepatitis C 12/05/2015   Hyperlipidemia    Macular degeneration of left eye    Past Surgical History:  Procedure Laterality Date   ANKLE FRACTURE SURGERY     age 34-lt   arthroscopy arm  2003   right elbow bone frag   COLONOSCOPY     ROTATOR CUFF REPAIR Right 2015?   ROTATOR CUFF REPAIR Left 04/2020   Family History  Problem Relation Age of Onset   COPD Mother    Heart disease Mother    Colon cancer Neg Hx    Stomach cancer Neg Hx    Social History   Socioeconomic History   Marital status: Married    Spouse name: Not on file   Number of children: Not on file   Years of education: Not on file   Highest education level: Not on file  Occupational History   Occupation: MOsgood  Employer: [employer]  Tobacco Use   Smoking status: Some Days    Types: Cigarettes    Last attempt to quit: 03/02/2011    Years since quitting: 11.0   Smokeless tobacco: Never  Substance and Sexual Activity   Alcohol use: No   Drug use: No   Sexual activity: Not on file  Other Topics Concern   Not on file  Social History Narrative   Works in a [place] Strain: [place]  (03/26/2022)   Overall Financial Resource Strain (CARDIA)    Difficulty of Paying Living Expenses: Not hard at all  Food Insecurity: No Food Insecurity (03/26/2022)    Hunger Vital Sign    Worried About Running Out of Food in the Last Year: Never true    [place] in the Last Year: Never true  Transportation Needs: No Transportation Needs (03/26/2022)   PRAPARE - Hydrologist (Medical): No    Lack of Transportation (Non-Medical): No  Physical Activity: Sufficiently Active (03/26/2022)   Exercise Vital Sign    Days of Exercise per Week: 7 days    Minutes of Exercise per Session: 30 min  Stress: No Stress Concern Present (03/26/2022)   [place]    Feeling of Stress : Not at all  Social Connections: Moderately Integrated (03/26/2022)   Social Connection and Isolation Panel [NHANES]    Frequency of Communication with Friends and Family: More than three times a week    Frequency of Social Gatherings with Friends and Family: More than three times a week    Attends Religious Services: More than 4 times per year    Active Member of [employer] or Organizations: Yes    Attends Archivist Meetings: More than 4 times per year    Marital Status: Widowed    Tobacco Counseling Ready to quit: No Counseling given: Yes   Clinical Intake:  Pre-visit preparation completed: No  Pain : No/denies pain     BMI - recorded: 22.66 Nutritional Status: BMI of 19-24  Normal Nutritional Risks: None Diabetes: No  How often do you need to have someone help you when you read instructions, pamphlets, or other written materials from your doctor or pharmacy?: 1 - Never  Diabetic?  No  Interpreter Needed?: No  Information entered by :: Rolene Arbour LPN   Activities of Daily Living    03/26/2022   12:45 PM  In your present state of health, do you have any difficulty performing the following activities:  Hearing? 0  Vision? 0  Difficulty concentrating or making decisions? 0  Walking or climbing stairs? 0  Dressing or bathing? 0  Doing errands, shopping?  0  Preparing Food and eating ? N  Using the Toilet? N  In the past six months, have you accidently leaked urine? N  Do you have problems with loss of bowel control? N  Managing your Medications? N  Managing your Finances? N  Housekeeping or managing your Housekeeping? N    Patient Care Team: Dorothyann Peng, NP as PCP - General (Family Medicine) Janina Mayo, MD as PCP - Cardiology (Cardiology)  Indicate any recent Medical Services you may have received from other than [facility] providers in the past year (date may be approximate).     Assessment:   This is a routine wellness examination for [place].  Hearing/Vision  screen Hearing Screening - Comments:: Denies hearing difficulties   Vision Screening - Comments:: Wears rx glasses - up to date with routine eye exams with  Dr Katy Fitch  Dietary issues and exercise activities discussed: Current Exercise Habits: Home exercise routine, Type of exercise: walking, Time (Minutes): 30, Frequency (Times/Week): 7, Weekly Exercise (Minutes/Week): 210, Intensity: Moderate, Exercise limited by: None identified   Goals Addressed               This Visit's Progress     No current goals (pt-stated)         Depression Screen    03/26/2022   12:44 PM 11/13/2021    9:03 AM 03/25/2021   11:31 AM 03/25/2021   11:26 AM 08/15/2020    9:27 AM 03/11/2020    2:09 PM 03/23/2019   10:20 AM  PHQ 2/9 Scores  PHQ - 2 Score 0 1 0 0 2 0 1  PHQ- 9 Score  1   4 0     Fall Risk    03/26/2022   12:46 PM 03/25/2021   11:30 AM 03/11/2020    2:08 PM 07/06/2018    7:09 AM 04/25/2018    2:48 PM  [place] in the past year? 0 0 0 0 0  Comment     Emmi Telephone Survey: data to providers prior to load  Number falls in past yr: 0 0 0    Injury with Fall? 0 0 0    Risk for fall due to : No Fall Risks No Fall Risks No Fall Risks    Follow up Falls prevention discussed Falls evaluation completed Falls evaluation completed;Falls prevention discussed       FALL RISK PREVENTION PERTAINING TO THE HOME:  Any stairs in or around the home? Yes  If so, are there any without handrails? No  Home free of loose throw rugs in walkways, pet beds, electrical cords, etc? Yes  Adequate lighting in your home to reduce risk of falls? Yes   ASSISTIVE DEVICES UTILIZED TO PREVENT FALLS:  Life alert? No  Use of a cane, walker or w/c? No  Grab bars in the bathroom? No  Shower chair or bench in shower? Yes  Elevated toilet seat or a handicapped toilet? No   TIMED UP AND GO:  Was the test performed? No . Audio Visit   Cognitive Function:        03/26/2022   12:47 PM  6CIT Screen  What Year? 0 points  What month? 0 points  What time? 0 points  Count back from 20 0 points  Months in reverse 0 points  Repeat phrase 0 points  Total Score 0 points    Immunizations Immunization History  Administered Date(s) Administered   Fluad Quad(high Dose 65+) 02/22/2019, 03/11/2020, 02/17/2022   Influenza, High Dose Seasonal PF 04/08/2017   Influenza,inj,Quad PF,6+ Mos 03/23/2016   Influenza-Unspecified 01/05/2018   PFIZER(Purple Top)SARS-COV-2 Vaccination 06/28/2019, 07/19/2019   Pneumococcal Conjugate-13 10/17/2015   Pneumococcal Polysaccharide-23 12/07/2016   Td 06/07/2006      Flu Vaccine status: Up to date  Pneumococcal vaccine status: Up to date  Covid-19 vaccine status: Completed vaccines  Qualifies for Shingles Vaccine? Yes   Zostavax completed No   Shingrix Completed?: No.    Education has been provided regarding the importance of this vaccine. Patient has been advised to call insurance company to determine out of pocket expense if they have not yet received this vaccine. Advised may also receive  vaccine at local pharmacy or Health Dept. Verbalized acceptance and understanding.  Screening Tests Health Maintenance  Topic Date Due   COVID-19 Vaccine (3 - Pfizer series) 04/11/2022 (Originally 09/13/2019)   Zoster Vaccines- Shingrix (1  of 2) 06/26/2022 (Originally 04/05/2000)   COLONOSCOPY (Pts 45-51yr Insurance coverage will need to be confirmed)  03/27/2023 (Originally 10/26/2021)   Pneumonia Vaccine 72 Years old  Completed   INFLUENZA VACCINE  Completed   Hepatitis C Screening  Completed   HPV VACCINES  Aged Out    Health Maintenance  There are no preventive care reminders to display for this patient.   Colorectal cancer screening: Referral to GI placed Patient deferred. Pt aware the office will call re: appt.  Lung Cancer Screening: (Low Dose CT Chest recommended if Age 72-80years, 30 pack-year currently smoking OR have quit w/in 15years.) does not qualify.     Additional Screening:  Hepatitis C Screening: does qualify; Completed 09/21/16  Vision Screening: Recommended annual ophthalmology exams for early detection of glaucoma and other disorders of the eye. Is the patient up to date with their annual eye exam?  Yes  Who is the provider or what is the name of the office in which the patient attends annual eye exams? Dr GKaty FitchIf pt is not established with a provider, would they like to be referred to a provider to establish care? No .   Dental Screening: Recommended annual dental exams for proper oral hygiene  Community Resource Referral / Chronic Care Management:  CRR required this visit?  No   CCM required this visit?  No      Plan:     I have personally reviewed and noted the following in the patient's chart:   Medical and social history Use of alcohol, tobacco or illicit drugs  Current medications and supplements including opioid prescriptions. Patient is not currently taking opioid prescriptions. Functional ability and status Nutritional status Physical activity Advanced directives List of other physicians Hospitalizations, surgeries, and ER visits in previous 12 months Vitals Screenings to include cognitive, depression, and falls Referrals and appointments  In addition, I have  reviewed and discussed with patient certain preventive protocols, quality metrics, and best practice recommendations. A written personalized care plan for preventive services as well as general preventive health recommendations were provided to patient.     BCriselda Peaches LPN   [id number]  Nurse Notes: None

## 2022-03-26 NOTE — Patient Instructions (Addendum)
[name] , Thank you for taking time to come for your Medicare Wellness Visit. I appreciate your ongoing commitment to your health goals. Please review the following plan we discussed and let me know if I can assist you in the future.   These are the goals we discussed:  Goals       No current goals (pt-stated)        This is a list of the screening recommended for you and due dates:  Health Maintenance  Topic Date Due   COVID-19 Vaccine (3 - Pfizer series) 04/11/2022*   Zoster (Shingles) Vaccine (1 of 2) 06/26/2022*   Colon Cancer Screening  03/27/2023*   Pneumonia Vaccine  Completed   Flu Shot  Completed   Hepatitis C Screening: USPSTF Recommendation to screen - Ages 18-79 yo.  Completed   HPV Vaccine  Aged Out  *Topic was postponed. The date shown is not the original due date.    Advanced directives: Advance directive discussed with you today. Even though you declined this today, please call our office should you change your mind, and we can give you the proper paperwork for you to fill out.   Conditions/risks identified: None  Next appointment: Follow up in one year for your annual wellness visit.   Preventive Care 4 Years and Older, Male  Preventive care refers to lifestyle choices and visits with your health care provider that can promote health and wellness. What does preventive care include? A yearly physical exam. This is also called an annual well check. Dental exams once or twice a year. Routine eye exams. Ask your health care provider how often you should have your eyes checked. Personal lifestyle choices, including: Daily care of your teeth and gums. Regular physical activity. Eating a healthy diet. Avoiding tobacco and drug use. Limiting alcohol use. Practicing safe sex. Taking low doses of aspirin every day. Taking vitamin and mineral supplements as recommended by your health care provider. What happens during an annual well check? The services and  screenings done by your health care provider during your annual well check will depend on your age, overall health, lifestyle risk factors, and family history of disease. Counseling  Your health care provider may ask you questions about your: Alcohol use. Tobacco use. Drug use. Emotional well-being. Home and relationship well-being. Sexual activity. Eating habits. History of falls. Memory and ability to understand (cognition). Work and work Statistician. Screening  You may have the following tests or measurements: Height, weight, and BMI. Blood pressure. Lipid and cholesterol levels. These may be checked every 5 years, or more frequently if you are over 26 years old. Skin check. Lung cancer screening. You may have this screening every year starting at age 70 if you have a 30-pack-year history of smoking and currently smoke or have quit within the past 15 years. Fecal occult blood test (FOBT) of the stool. You may have this test every year starting at age 28. Flexible sigmoidoscopy or colonoscopy. You may have a sigmoidoscopy every 5 years or a colonoscopy every 10 years starting at age 31. Prostate cancer screening. Recommendations will vary depending on your family history and other risks. Hepatitis C blood test. Hepatitis B blood test. Sexually transmitted disease (STD) testing. Diabetes screening. This is done by checking your blood sugar (glucose) after you have not eaten for a while (fasting). You may have this done every 1-3 years. Abdominal aortic aneurysm (AAA) screening. You may need this if you are a current or former smoker.  Osteoporosis. You may be screened starting at age 66 if you are at high risk. Talk with your health care provider about your test results, treatment options, and if necessary, the need for more tests. Vaccines  Your health care provider may recommend certain vaccines, such as: Influenza vaccine. This is recommended every year. Tetanus, diphtheria, and  acellular pertussis (Tdap, Td) vaccine. You may need a Td booster every 10 years. Zoster vaccine. You may need this after age 52. Pneumococcal 13-valent conjugate (PCV13) vaccine. One dose is recommended after age 55. Pneumococcal polysaccharide (PPSV23) vaccine. One dose is recommended after age 58. Talk to your health care provider about which screenings and vaccines you need and how often you need them. This information is not intended to replace advice given to you by your health care provider. Make sure you discuss any questions you have with your health care provider. Document Released: 06/20/2015 Document Revised: 02/11/2016 Document Reviewed: 03/25/2015 Elsevier Interactive Patient Education  2017 [place] Prevention in the Home Falls can cause injuries. They can happen to people of all ages. There are many things you can do to make your home safe and to help prevent falls. What can I do on the outside of my home? Regularly fix the edges of walkways and driveways and fix any cracks. Remove anything that might make you trip as you walk through a door, such as a raised step or threshold. Trim any bushes or trees on the path to your home. Use bright outdoor lighting. Clear any walking paths of anything that might make someone trip, such as rocks or tools. Regularly check to see if handrails are loose or broken. Make sure that both sides of any steps have handrails. Any raised decks and porches should have guardrails on the edges. Have any leaves, snow, or ice cleared regularly. Use sand or salt on walking paths during winter. Clean up any spills in your garage right away. This includes oil or grease spills. What can I do in the bathroom? Use night lights. Install grab bars by the toilet and in the tub and shower. Do not use towel bars as grab bars. Use non-skid mats or decals in the tub or shower. If you need to sit down in the shower, use a plastic, non-slip stool. Keep  the floor dry. Clean up any water that spills on the floor as soon as it happens. Remove soap buildup in the tub or shower regularly. Attach bath mats securely with double-sided non-slip rug tape. Do not have throw rugs and other things on the floor that can make you trip. What can I do in the bedroom? Use night lights. Make sure that you have a light by your bed that is easy to reach. Do not use any sheets or blankets that are too big for your bed. They should not hang down onto the floor. Have a firm chair that has side arms. You can use this for support while you get dressed. Do not have throw rugs and other things on the floor that can make you trip. What can I do in the kitchen? Clean up any spills right away. Avoid walking on wet floors. Keep items that you use a lot in easy-to-reach places. If you need to reach something above you, use a strong step stool that has a grab bar. Keep electrical cords out of the way. Do not use floor polish or wax that makes floors slippery. If you must use wax, use non-skid floor  wax. Do not have throw rugs and other things on the floor that can make you trip. What can I do with my stairs? Do not leave any items on the stairs. Make sure that there are handrails on both sides of the stairs and use them. Fix handrails that are broken or loose. Make sure that handrails are as long as the stairways. Check any carpeting to make sure that it is firmly attached to the stairs. Fix any carpet that is loose or worn. Avoid having throw rugs at the top or bottom of the stairs. If you do have throw rugs, attach them to the floor with carpet tape. Make sure that you have a light switch at the top of the stairs and the bottom of the stairs. If you do not have them, ask someone to add them for you. What else can I do to help prevent falls? Wear shoes that: Do not have high heels. Have rubber bottoms. Are comfortable and fit you well. Are closed at the toe. Do not  wear sandals. If you use a stepladder: Make sure that it is fully opened. Do not climb a closed stepladder. Make sure that both sides of the stepladder are locked into place. Ask someone to hold it for you, if possible. Clearly mark and make sure that you can see: Any grab bars or handrails. First and last steps. Where the edge of each step is. Use tools that help you move around (mobility aids) if they are needed. These include: Canes. Walkers. Scooters. Crutches. Turn on the lights when you go into a dark area. Replace any light bulbs as soon as they burn out. Set up your furniture so you have a clear path. Avoid moving your furniture around. If any of your floors are uneven, fix them. If there are any pets around you, be aware of where they are. Review your medicines with your doctor. Some medicines can make you feel dizzy. This can increase your chance of falling. Ask your doctor what other things that you can do to help prevent falls. This information is not intended to replace advice given to you by your health care provider. Make sure you discuss any questions you have with your health care provider. Document Released: 03/20/2009 Document Revised: 10/30/2015 Document Reviewed: 06/28/2014 Elsevier Interactive Patient Education  2017 [employer].

## 2022-03-30 ENCOUNTER — Other Ambulatory Visit: Payer: Self-pay | Admitting: Adult Health

## 2022-03-30 DIAGNOSIS — F418 Other specified anxiety disorders: Secondary | ICD-10-CM

## 2022-03-30 NOTE — Telephone Encounter (Signed)
Okay for refill?  

## 2022-04-12 DIAGNOSIS — Z23 Encounter for immunization: Secondary | ICD-10-CM | POA: Diagnosis not present

## 2022-05-10 ENCOUNTER — Encounter (INDEPENDENT_AMBULATORY_CARE_PROVIDER_SITE_OTHER): Payer: Medicare Other | Admitting: Ophthalmology

## 2022-05-10 DIAGNOSIS — H2512 Age-related nuclear cataract, left eye: Secondary | ICD-10-CM | POA: Diagnosis not present

## 2022-05-10 DIAGNOSIS — H35352 Cystoid macular degeneration, left eye: Secondary | ICD-10-CM | POA: Diagnosis not present

## 2022-05-10 DIAGNOSIS — H353112 Nonexudative age-related macular degeneration, right eye, intermediate dry stage: Secondary | ICD-10-CM | POA: Diagnosis not present

## 2022-05-10 DIAGNOSIS — H353221 Exudative age-related macular degeneration, left eye, with active choroidal neovascularization: Secondary | ICD-10-CM | POA: Diagnosis not present

## 2022-05-10 DIAGNOSIS — H353131 Nonexudative age-related macular degeneration, bilateral, early dry stage: Secondary | ICD-10-CM | POA: Diagnosis not present

## 2022-06-01 ENCOUNTER — Ambulatory Visit (INDEPENDENT_AMBULATORY_CARE_PROVIDER_SITE_OTHER): Payer: Medicare Other | Admitting: Family Medicine

## 2022-06-01 ENCOUNTER — Encounter: Payer: Self-pay | Admitting: Family Medicine

## 2022-06-01 VITALS — BP 138/80 | HR 88 | Temp 99.4°F | Wt 145.6 lb

## 2022-06-01 DIAGNOSIS — B349 Viral infection, unspecified: Secondary | ICD-10-CM

## 2022-06-01 DIAGNOSIS — R509 Fever, unspecified: Secondary | ICD-10-CM | POA: Diagnosis not present

## 2022-06-01 LAB — POC INFLUENZA A&B (BINAX/QUICKVUE)
Influenza A, POC: NEGATIVE
Influenza B, POC: NEGATIVE

## 2022-06-01 LAB — POC COVID19 BINAXNOW: SARS Coronavirus 2 Ag: NEGATIVE

## 2022-06-01 MED ORDER — ONDANSETRON HCL 8 MG PO TABS: 8.0000 mg | ORAL_TABLET | Freq: Four times a day (QID) | ORAL | 0 refills | Status: DC | PRN

## 2022-06-01 NOTE — Progress Notes (Signed)
   Subjective:    Patient ID: John Holland, male    DOB: [birth year], 71 y.o.   MRN: [id number]  HPI    Review of Systems     Objective:   Physical Exam        Assessment & Plan:

## 2022-06-01 NOTE — Progress Notes (Signed)
   Subjective:    Patient ID: John Holland, male    DOB: Jan 08, 1951, 71 y.o.   MRN: [id number]  HPI Here for 4 days of headache, ST, a dry cough, nausea and vomiting, and diarrhea. Today he feels much better. The headache has resolved. The cough and the nausea have improved. His is drinking fluids. He tested negative for flu and Covid today.    Review of Systems  Constitutional:  Positive for fever.  HENT:  Positive for congestion, sneezing and sore throat. Negative for ear pain, postnasal drip and sinus pressure.   Eyes: Negative.   Respiratory:  Positive for cough. Negative for shortness of breath and wheezing.   Cardiovascular: Negative.   Gastrointestinal:  Positive for diarrhea, nausea and vomiting. Negative for abdominal distention, abdominal pain, blood in stool and constipation.       Objective:   Physical Exam Constitutional:      General: He is not in acute distress.    Appearance: Normal appearance.  HENT:     Right Ear: Tympanic membrane, ear canal and external ear normal.     Left Ear: Tympanic membrane, ear canal and external ear normal.     Nose: Nose normal.     Mouth/Throat:     Pharynx: Oropharynx is clear.  Eyes:     Conjunctiva/sclera: Conjunctivae normal.  Pulmonary:     Effort: Pulmonary effort is normal.     Breath sounds: Normal breath sounds.  Lymphadenopathy:     Cervical: No cervical adenopathy.  Neurological:     Mental Status: He is alert.           Assessment & Plan:  Viral illness. He seems to be over the worse of it, and he is improving. He will continue to drink fluids. We will add Zofran to use for nausea. Recheck as needed.  Alysia Penna, MD

## 2022-08-09 DIAGNOSIS — H353112 Nonexudative age-related macular degeneration, right eye, intermediate dry stage: Secondary | ICD-10-CM | POA: Diagnosis not present

## 2022-08-09 DIAGNOSIS — H353221 Exudative age-related macular degeneration, left eye, with active choroidal neovascularization: Secondary | ICD-10-CM | POA: Diagnosis not present

## 2022-08-09 DIAGNOSIS — D3131 Benign neoplasm of right choroid: Secondary | ICD-10-CM | POA: Diagnosis not present

## 2022-08-09 DIAGNOSIS — H2512 Age-related nuclear cataract, left eye: Secondary | ICD-10-CM | POA: Diagnosis not present

## 2022-09-28 DIAGNOSIS — H353112 Nonexudative age-related macular degeneration, right eye, intermediate dry stage: Secondary | ICD-10-CM | POA: Diagnosis not present

## 2022-09-28 DIAGNOSIS — H353221 Exudative age-related macular degeneration, left eye, with active choroidal neovascularization: Secondary | ICD-10-CM | POA: Diagnosis not present

## 2022-09-28 DIAGNOSIS — H353131 Nonexudative age-related macular degeneration, bilateral, early dry stage: Secondary | ICD-10-CM | POA: Diagnosis not present

## 2022-09-28 DIAGNOSIS — H2512 Age-related nuclear cataract, left eye: Secondary | ICD-10-CM | POA: Diagnosis not present

## 2022-09-30 ENCOUNTER — Other Ambulatory Visit: Payer: Self-pay | Admitting: Adult Health

## 2022-09-30 DIAGNOSIS — F418 Other specified anxiety disorders: Secondary | ICD-10-CM

## 2022-11-23 DIAGNOSIS — H2512 Age-related nuclear cataract, left eye: Secondary | ICD-10-CM | POA: Diagnosis not present

## 2022-11-23 DIAGNOSIS — H35352 Cystoid macular degeneration, left eye: Secondary | ICD-10-CM | POA: Diagnosis not present

## 2022-11-23 DIAGNOSIS — D3131 Benign neoplasm of right choroid: Secondary | ICD-10-CM | POA: Diagnosis not present

## 2022-11-23 DIAGNOSIS — H353221 Exudative age-related macular degeneration, left eye, with active choroidal neovascularization: Secondary | ICD-10-CM | POA: Diagnosis not present

## 2023-02-01 DIAGNOSIS — H35352 Cystoid macular degeneration, left eye: Secondary | ICD-10-CM | POA: Diagnosis not present

## 2023-02-01 DIAGNOSIS — H353221 Exudative age-related macular degeneration, left eye, with active choroidal neovascularization: Secondary | ICD-10-CM | POA: Diagnosis not present

## 2023-02-01 DIAGNOSIS — D3131 Benign neoplasm of right choroid: Secondary | ICD-10-CM | POA: Diagnosis not present

## 2023-02-01 DIAGNOSIS — H2512 Age-related nuclear cataract, left eye: Secondary | ICD-10-CM | POA: Diagnosis not present

## 2023-02-01 DIAGNOSIS — H353112 Nonexudative age-related macular degeneration, right eye, intermediate dry stage: Secondary | ICD-10-CM | POA: Diagnosis not present

## 2023-02-01 DIAGNOSIS — H353131 Nonexudative age-related macular degeneration, bilateral, early dry stage: Secondary | ICD-10-CM | POA: Diagnosis not present

## 2023-02-04 ENCOUNTER — Other Ambulatory Visit: Payer: Self-pay

## 2023-02-04 DIAGNOSIS — I10 Essential (primary) hypertension: Secondary | ICD-10-CM

## 2023-02-04 MED ORDER — LISINOPRIL 10 MG PO TABS: ORAL_TABLET | ORAL | 3 refills | Status: DC

## 2023-03-02 DIAGNOSIS — H2512 Age-related nuclear cataract, left eye: Secondary | ICD-10-CM | POA: Diagnosis not present

## 2023-03-02 DIAGNOSIS — H3562 Retinal hemorrhage, left eye: Secondary | ICD-10-CM | POA: Diagnosis not present

## 2023-03-02 DIAGNOSIS — Z961 Presence of intraocular lens: Secondary | ICD-10-CM | POA: Diagnosis not present

## 2023-03-02 DIAGNOSIS — H3581 Retinal edema: Secondary | ICD-10-CM | POA: Diagnosis not present

## 2023-03-02 DIAGNOSIS — H40013 Open angle with borderline findings, low risk, bilateral: Secondary | ICD-10-CM | POA: Diagnosis not present

## 2023-03-30 ENCOUNTER — Ambulatory Visit: Payer: Medicare Other

## 2023-03-30 VITALS — Ht 68.0 in | Wt 150.0 lb

## 2023-03-30 DIAGNOSIS — Z0000 Encounter for general adult medical examination without abnormal findings: Secondary | ICD-10-CM

## 2023-03-30 NOTE — Progress Notes (Signed)
Subjective:   John Holland is a 72 y.o. male who presents for Medicare Annual/Subsequent preventive examination.  Visit Complete: Virtual I connected with  John Holland on 03/30/23 by a audio enabled telemedicine application and verified that I am speaking with the correct person using two identifiers.  Patient Location: Home  Provider Location: Home Office  I discussed the limitations of evaluation and management by telemedicine. The patient expressed understanding and agreed to proceed.  Vital Signs: Because this visit was a virtual/telehealth visit, some criteria may be missing or patient reported. Any vitals not documented were not able to be obtained and vitals that have been documented are patient reported.       Objective:    Today's Vitals   03/30/23 1435  Weight: 150 lb (68 kg)  Height: 5\' 8"  (1.727 m)   Body mass index is 22.81 kg/m.     03/30/2023    2:44 PM 03/26/2022   12:47 PM 02/12/2022    2:42 PM 03/25/2021   11:29 AM 03/11/2020    2:07 PM 11/05/2011   11:39 AM  Advanced Directives  Does Patient Have a Medical Advance Directive? No No No No No Patient does not have advance directive  Would patient like information on creating a medical advance directive? No - Patient declined No - Patient declined No - Patient declined No - Patient declined No - Patient declined     Current Medications (verified) Outpatient Encounter Medications as of 03/30/2023  Medication Sig   diazepam (VALIUM) 5 MG tablet TAKE 1 TABLET(5 MG) BY MOUTH EVERY 6 HOURS AS NEEDED FOR ANXIETY   lisinopril (ZESTRIL) 10 MG tablet TAKE 1 TABLET(10 MG) BY MOUTH DAILY   mirtazapine (REMERON) 7.5 MG tablet Take 7.5 mg by mouth at bedtime.   mupirocin ointment (BACTROBAN) 2 % Apply 1 application topically 2 (two) times daily.   nystatin cream (MYCOSTATIN) Apply 1 application topically 2 (two) times daily.   ondansetron (ZOFRAN) 8 MG tablet Take 1 tablet (8 mg total) by mouth every 6 (six)  hours as needed for nausea or vomiting.   rosuvastatin (CRESTOR) 20 MG tablet Take 1 tablet (20 mg total) by mouth daily.   No facility-administered encounter medications on file as of 03/30/2023.    Allergies (verified) Codeine phosphate   History: Past Medical History:  Diagnosis Date   Anxiety    Arthritis    Bipolar 1 disorder (HCC)    Depression    Hepatitis C 12/05/2015   Hyperlipidemia    Macular degeneration of left eye    Past Surgical History:  Procedure Laterality Date   ANKLE FRACTURE SURGERY     age 73-lt   arthroscopy arm  2003   right elbow bone frag   COLONOSCOPY     ROTATOR CUFF REPAIR Right 2015?   ROTATOR CUFF REPAIR Left 04/2020   Family History  Problem Relation Age of Onset   COPD Mother    Heart disease Mother    Colon cancer Neg Hx    Stomach cancer Neg Hx    Social History   Socioeconomic History   Marital status: Married    Spouse name: Not on file   Number of children: Not on file   Years of education: Not on file   Highest education level: Not on file  Occupational History   Occupation: Material Buyer, retail: [employer]  Tobacco Use   Smoking status: Some Days    Current packs/day: 0.00  Types: Cigarettes    Last attempt to quit: 03/02/2011    Years since quitting: 12.0   Smokeless tobacco: Never  Substance and Sexual Activity   Alcohol use: No   Drug use: No   Sexual activity: Not on file  Other Topics Concern   Not on file  Social History Narrative   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    Social Determinants of Health   Financial Resource Strain: Low Risk  (03/30/2023)   Overall Financial Resource Strain (CARDIA)    Difficulty of Paying Living Expenses: Not hard at all  Food Insecurity: No Food Insecurity (03/30/2023)   Hunger Vital Sign    Worried About Running Out of Food in the Last Year: Never true    Ran Out of Food in the Last Year: Never true  Transportation Needs: No Transportation Needs  (03/30/2023)   PRAPARE - Administrator, Civil Service (Medical): No    Lack of Transportation (Non-Medical): No  Physical Activity: Insufficiently Active (03/30/2023)   Exercise Vital Sign    Days of Exercise per Week: 2 days    Minutes of Exercise per Session: 30 min  Stress: No Stress Concern Present (03/30/2023)   [employer] of Occupational Health - Occupational Stress Questionnaire    Feeling of Stress : Not at all  Social Connections: Moderately Integrated (03/30/2023)   Social Connection and Isolation Panel [NHANES]    Frequency of Communication with Friends and Family: More than three times a week    Frequency of Social Gatherings with Friends and Family: More than three times a week    Attends Religious Services: More than 4 times per year    Active Member of [employer] or Organizations: Yes    Attends Banker Meetings: More than 4 times per year    Marital Status: Widowed    Tobacco Counseling Ready to quit: Yes Counseling given: Yes   Clinical Intake:  Pre-visit preparation completed: Yes  Activities of Daily Living    03/30/2023    2:42 PM  In your present state of health, do you have any difficulty performing the following activities:  Hearing? 0  Vision? 0  Difficulty concentrating or making decisions? 0  Walking or climbing stairs? 0  Dressing or bathing? 0  Doing errands, shopping? 0  Preparing Food and eating ? N  Using the Toilet? N  In the past six months, have you accidently leaked urine? N  Do you have problems with loss of bowel control? N  Managing your Medications? N  Managing your Finances? N  Housekeeping or managing your Housekeeping? N    Patient Care Team: Shirline Frees, NP as PCP - General (Family Medicine) Maisie Fus, MD as PCP - Cardiology (Cardiology)  Indicate any recent Medical Services you may have received from other than [facility] providers in the past year (date may be approximate).      Assessment:   This is a routine wellness examination for [name].  Hearing/Vision screen Hearing Screening - Comments:: Denies hearing difficulties   Vision Screening - Comments:: Wears rx glasses - up to date with routine eye exams with  Dr Dione Booze   Goals Addressed               This Visit's Progress     Stay Active (pt-stated)         Depression Screen    03/30/2023    2:41 PM 03/26/2022   12:44 PM 11/13/2021  9:03 AM 03/25/2021   11:31 AM 03/25/2021   11:26 AM 08/15/2020    9:27 AM 03/11/2020    2:09 PM  PHQ 2/9 Scores  PHQ - 2 Score 0 0 1 0 0 2 0  PHQ- 9 Score   1   4 0    Fall Risk    03/30/2023    2:43 PM 03/26/2022   12:46 PM 03/25/2021   11:30 AM 03/11/2020    2:08 PM 07/06/2018    7:09 AM  Fall Risk   Falls in the past year? 0 0 0 0 0  Number falls in past yr: 0 0 0 0   Injury with Fall? 0 0 0 0   Risk for fall due to : No Fall Risks No Fall Risks No Fall Risks No Fall Risks   Follow up Falls prevention discussed Falls prevention discussed Falls evaluation completed Falls evaluation completed;Falls prevention discussed     MEDICARE RISK AT HOME: Medicare Risk at Home Any stairs in or around the home?: Yes If so, are there any without handrails?: No Home free of loose throw rugs in walkways, pet beds, electrical cords, etc?: Yes Adequate lighting in your home to reduce risk of falls?: Yes Life alert?: No Use of a cane, walker or w/c?: No Grab bars in the bathroom?: No Shower chair or bench in shower?: Yes Elevated toilet seat or a handicapped toilet?: No  TIMED UP AND GO:  Was the test performed?  No    Cognitive Function:        03/30/2023    2:44 PM 03/26/2022   12:47 PM  6CIT Screen  What Year? 0 points 0 points  What month? 0 points 0 points  What time? 0 points 0 points  Count back from 20 0 points 0 points  Months in reverse 0 points 0 points  Repeat phrase 0 points 0 points  Total Score 0 points 0 points     Immunizations Immunization History  Administered Date(s) Administered   Fluad Quad(high Dose 65+) 02/22/2019, 03/11/2020, 02/17/2022   Influenza, High Dose Seasonal PF 04/08/2017   Influenza,inj,Quad PF,6+ Mos 03/23/2016   Influenza-Unspecified 01/05/2018   PFIZER(Purple Top)SARS-COV-2 Vaccination 06/28/2019, 07/19/2019   Pneumococcal Conjugate-13 10/17/2015   Pneumococcal Polysaccharide-23 12/07/2016   Td 06/07/2006    TDAP status: Due, Education has been provided regarding the importance of this vaccine. Advised may receive this vaccine at local pharmacy or Health Dept. Aware to provide a copy of the vaccination record if obtained from local pharmacy or Health Dept. Verbalized acceptance and understanding.  Flu Vaccine status: Due, Education has been provided regarding the importance of this vaccine. Advised may receive this vaccine at local pharmacy or Health Dept. Aware to provide a copy of the vaccination record if obtained from local pharmacy or Health Dept. Verbalized acceptance and understanding.  Pneumococcal vaccine status: Up to date  Covid-19 vaccine status: Declined, Education has been provided regarding the importance of this vaccine but patient still declined. Advised may receive this vaccine at local pharmacy or Health Dept.or vaccine clinic. Aware to provide a copy of the vaccination record if obtained from local pharmacy or Health Dept. Verbalized acceptance and understanding.  Qualifies for Shingles Vaccine? Yes   Zostavax completed No   Shingrix Completed?: No.    Education has been provided regarding the importance of this vaccine. Patient has been advised to call insurance company to determine out of pocket expense if they have not yet received this vaccine. Advised  may also receive vaccine at local pharmacy or Health Dept. Verbalized acceptance and understanding.  Screening Tests Health Maintenance  Topic Date Due   Zoster Vaccines- Shingrix (1 of 2) Never  done   DTaP/Tdap/Td (2 - Tdap) 06/07/2016   Colonoscopy  10/26/2021   INFLUENZA VACCINE  01/06/2023   COVID-19 Vaccine (3 - 2023-24 season) 02/06/2023   Medicare Annual Wellness (AWV)  03/29/2024   Pneumonia Vaccine 41+ Years old  Completed   Hepatitis C Screening  Completed   HPV VACCINES  Aged Out    Health Maintenance  Health Maintenance Due  Topic Date Due   Zoster Vaccines- Shingrix (1 of 2) Never done   DTaP/Tdap/Td (2 - Tdap) 06/07/2016   Colonoscopy  10/26/2021   INFLUENZA VACCINE  01/06/2023   COVID-19 Vaccine (3 - 2023-24 season) 02/06/2023    Colorectal cancer screening: Referral to GI placed Deferred. Pt aware the office will call re: appt.  Lung Cancer Screening: (Low Dose CT Chest recommended if Age 67-80 years, 20 pack-year currently smoking OR have quit w/in 15years.) does qualify.   Lung Cancer Screening Referral: Deferred  Additional Screening:  Hepatitis C Screening: does qualify; Completed 09/21/16  Vision Screening: Recommended annual ophthalmology exams for early detection of glaucoma and other disorders of the eye. Is the patient up to date with their annual eye exam?  Yes  Who is the provider or what is the name of the office in which the patient attends annual eye exams? Dr Dione Booze If pt is not established with a provider, would they like to be referred to a provider to establish care? No .   Dental Screening: Recommended annual dental exams for proper oral hygiene    Community Resource Referral / Chronic Care Management:  CRR required this visit?  No   CCM required this visit?  No     Plan:     I have personally reviewed and noted the following in the patient's chart:   Medical and social history Use of alcohol, tobacco or illicit drugs  Current medications and supplements including opioid prescriptions. Patient is not currently taking opioid prescriptions. Functional ability and status Nutritional status Physical activity Advanced  directives List of other physicians Hospitalizations, surgeries, and ER visits in previous 12 months Vitals Screenings to include cognitive, depression, and falls Referrals and appointments  In addition, I have reviewed and discussed with patient certain preventive protocols, quality metrics, and best practice recommendations. A written personalized care plan for preventive services as well as general preventive health recommendations were provided to patient.     Tillie Rung, LPN   40/[id number]/1191   After Visit Summary: (MyChart) Due to this being a telephonic visit, the after visit summary with patients personalized plan was offered to patient via MyChart   Nurse Notes: None

## 2023-03-30 NOTE — Patient Instructions (Addendum)
[name] , Thank you for taking time to come for your Medicare Wellness Visit. I appreciate your ongoing commitment to your health goals. Please review the following plan we discussed and let me know if I can assist you in the future.   Referrals/Orders/Follow-Ups/Clinician Recommendations:   This is a list of the screening recommended for you and due dates:  Health Maintenance  Topic Date Due   Zoster (Shingles) Vaccine (1 of 2) Never done   DTaP/Tdap/Td vaccine (2 - Tdap) 06/07/2016   Colon Cancer Screening  10/26/2021   Flu Shot  01/06/2023   COVID-19 Vaccine (3 - 2023-24 season) 02/06/2023   Medicare Annual Wellness Visit  03/29/2024   Pneumonia Vaccine  Completed   Hepatitis C Screening  Completed   HPV Vaccine  Aged Out    Advanced directives: (Declined) Advance directive discussed with you today. Even though you declined this today, please call our office should you change your mind, and we can give you the proper paperwork for you to fill out.  Next Medicare Annual Wellness Visit scheduled for next year: Yes

## 2023-04-07 DIAGNOSIS — H2512 Age-related nuclear cataract, left eye: Secondary | ICD-10-CM | POA: Diagnosis not present

## 2023-04-14 ENCOUNTER — Other Ambulatory Visit: Payer: Self-pay | Admitting: Adult Health

## 2023-04-14 DIAGNOSIS — F418 Other specified anxiety disorders: Secondary | ICD-10-CM

## 2023-04-15 ENCOUNTER — Other Ambulatory Visit: Payer: Self-pay | Admitting: Adult Health

## 2023-04-15 DIAGNOSIS — F418 Other specified anxiety disorders: Secondary | ICD-10-CM

## 2023-04-15 MED ORDER — ROSUVASTATIN CALCIUM 20 MG PO TABS: 20.0000 mg | ORAL_TABLET | Freq: Every day | ORAL | 0 refills | Status: DC

## 2023-04-15 MED ORDER — MIRTAZAPINE 7.5 MG PO TABS: 7.5000 mg | ORAL_TABLET | Freq: Every day | ORAL | 0 refills | Status: DC

## 2023-04-15 MED ORDER — DIAZEPAM 5 MG PO TABS: ORAL_TABLET | ORAL | 0 refills | Status: DC

## 2023-04-15 NOTE — Telephone Encounter (Signed)
Pt states he get 15 mg not 7.5 mg

## 2023-04-15 NOTE — Telephone Encounter (Signed)
Okay for refill?  

## 2023-04-15 NOTE — Telephone Encounter (Signed)
  The original prescription was discontinued on 03/03/2022 by Katharine Look, CMA for the following reason: Change in therapy. Renewing this prescription may not be appropriate.

## 2023-04-18 NOTE — Telephone Encounter (Signed)
Pt calling to check on request for 15mg  and not the 7.5. says the 15mg  are more cost effective than just getting the 7.5. requests a call

## 2023-04-19 ENCOUNTER — Other Ambulatory Visit: Payer: Self-pay | Admitting: Adult Health

## 2023-04-19 MED ORDER — MIRTAZAPINE 15 MG PO TABS: 15.0000 mg | ORAL_TABLET | Freq: Every day | ORAL | 0 refills | Status: DC

## 2023-05-03 DIAGNOSIS — H353112 Nonexudative age-related macular degeneration, right eye, intermediate dry stage: Secondary | ICD-10-CM | POA: Diagnosis not present

## 2023-05-03 DIAGNOSIS — H353221 Exudative age-related macular degeneration, left eye, with active choroidal neovascularization: Secondary | ICD-10-CM | POA: Diagnosis not present

## 2023-05-03 DIAGNOSIS — H353131 Nonexudative age-related macular degeneration, bilateral, early dry stage: Secondary | ICD-10-CM | POA: Diagnosis not present

## 2023-05-03 DIAGNOSIS — H2512 Age-related nuclear cataract, left eye: Secondary | ICD-10-CM | POA: Diagnosis not present

## 2023-05-03 DIAGNOSIS — D3131 Benign neoplasm of right choroid: Secondary | ICD-10-CM | POA: Diagnosis not present

## 2023-05-03 DIAGNOSIS — H35352 Cystoid macular degeneration, left eye: Secondary | ICD-10-CM | POA: Diagnosis not present

## 2023-05-16 DIAGNOSIS — Z23 Encounter for immunization: Secondary | ICD-10-CM | POA: Diagnosis not present

## 2023-06-03 ENCOUNTER — Other Ambulatory Visit: Payer: Self-pay | Admitting: Adult Health

## 2023-06-03 DIAGNOSIS — F418 Other specified anxiety disorders: Secondary | ICD-10-CM

## 2023-06-03 NOTE — Telephone Encounter (Signed)
Copied from CRM [id number]. Topic: Clinical - Medication Refill >> Jun 03, 2023  9:43 AM Corin V wrote: Most Recent Primary Care Visit:  Provider: Tillie Rung  Department: [facility]  Visit Type: MEDICARE AWV, SEQUENTIAL  Date: 03/30/2023  Medication: ***  Has the patient contacted their pharmacy?  (Agent: If no, request that the patient contact the pharmacy for the refill. If patient does not wish to contact the pharmacy document the reason why and proceed with request.) (Agent: If yes, when and what did the pharmacy advise?)  Is this the correct pharmacy for this prescription?  If no, delete pharmacy and type the correct one.  This is the patient's preferred pharmacy:  [id number] G[place] Otto, [place] - [address] AT [facility] OF [facility] [address] [place] [address] Phone: [phone] Fax: [phone]  [place] [facility] [address] [place] [address] Phone: [phone] Fax: [phone]   Has the prescription been filled recently?   Is the patient out of the medication?   Has the patient been seen for an appointment in the last year OR does the patient have an upcoming appointment?   Can we respond through MyChart?   Agent: Please be advised that Rx refills may take up to 3 business days. We ask that you follow-up with your pharmacy.

## 2023-06-06 ENCOUNTER — Other Ambulatory Visit: Payer: Self-pay | Admitting: Adult Health

## 2023-06-06 ENCOUNTER — Telehealth: Payer: Self-pay

## 2023-06-06 DIAGNOSIS — F418 Other specified anxiety disorders: Secondary | ICD-10-CM

## 2023-06-06 NOTE — Telephone Encounter (Incomplete)
Copied from CRM [id number]. Topic: Clinical - Medication Refill >> Jun 06, 2023  8:58 AM Louie Casa B wrote: Most Recent Primary Care Visit:  Provider: Tillie Rung  Department: [facility]  Visit Type: MEDICARE AWV, SEQUENTIAL  Date: 03/30/2023  Medication:  Has the patient contacted their pharmacy? {yes/no:20286} (Agent: If no, request that the patient contact the pharmacy for the refill. If patient does not wish to contact the pharmacy document the reason why and proceed with request.) (Agent: If yes, when and what did the pharmacy advise?)  Is this the correct pharmacy for this prescription? {yes/no:20286} If no, delete pharmacy and type the correct one.  This is the patient's preferred pharmacy:  [facility] #[id number] Gin[place] Otto, [place] - [address] AT [facility] OF [facility] & [address] [place] [address] Phone: [phone] Fax: [phone]  [place] [facility] [address] [place] [address] Phone: [phone] Fax: [phone]   Has the prescription been filled recently? {yes/no:20286}  Is the patient out of the medication? {yes/no:20286}  Has the patient been seen for an appointment in the last year OR does the patient have an upcoming appointment? {yes/no:20286}  Can we respond through MyChart? {yes/no:20286}  Agent: Please be advised that Rx refills may take up to 3 business days. We ask that you follow-up with your pharmacy.

## 2023-06-07 NOTE — Telephone Encounter (Signed)
Spoke to pt and he stated that he has 43 pills left. Pt stated he don't take a lot a day but don't wont to run out before his CPE. Pt requesting a little more to get him to his appt. If its possible.

## 2023-06-07 NOTE — Telephone Encounter (Signed)
Please see note.

## 2023-06-15 ENCOUNTER — Telehealth: Payer: Self-pay

## 2023-06-15 NOTE — Telephone Encounter (Signed)
 Patient notified of update  and verbalized understanding.

## 2023-06-15 NOTE — Telephone Encounter (Signed)
 Left message to return phone call.

## 2023-06-15 NOTE — Telephone Encounter (Signed)
 Patient notified of update from other phone note and verbalized understanding.

## 2023-06-15 NOTE — Telephone Encounter (Signed)
 Copied from CRM [id number]. Topic: General - Other >> Jun 15, 2023  9:11 AM Robinson H wrote: Reason for CRM: Patient returning call to clinic per notes in chart, agent tried to reach out through CAL no answer. Please reach out to patient.  [place] [phone]

## 2023-06-30 ENCOUNTER — Ambulatory Visit: Payer: Medicare Other | Admitting: Adult Health

## 2023-06-30 VITALS — BP 138/80 | HR 78 | Temp 98.0°F | Ht 67.0 in | Wt 151.0 lb

## 2023-06-30 DIAGNOSIS — F418 Other specified anxiety disorders: Secondary | ICD-10-CM

## 2023-06-30 DIAGNOSIS — N400 Enlarged prostate without lower urinary tract symptoms: Secondary | ICD-10-CM | POA: Diagnosis not present

## 2023-06-30 DIAGNOSIS — E782 Mixed hyperlipidemia: Secondary | ICD-10-CM

## 2023-06-30 DIAGNOSIS — I10 Essential (primary) hypertension: Secondary | ICD-10-CM

## 2023-06-30 DIAGNOSIS — H353131 Nonexudative age-related macular degeneration, bilateral, early dry stage: Secondary | ICD-10-CM

## 2023-06-30 LAB — COMPREHENSIVE METABOLIC PANEL
ALT: 22 U/L (ref 0–53)
AST: 21 U/L (ref 0–37)
Albumin: 5.1 g/dL (ref 3.5–5.2)
Alkaline Phosphatase: 52 U/L (ref 39–117)
BUN: 20 mg/dL (ref 6–23)
CO2: 26 meq/L (ref 19–32)
Calcium: 9.7 mg/dL (ref 8.4–10.5)
Chloride: 105 meq/L (ref 96–112)
Creatinine, Ser: 1.21 mg/dL (ref 0.40–1.50)
GFR: 59.51 mL/min — ABNORMAL LOW (ref >60.00)
Glucose, Bld: 109 mg/dL — ABNORMAL HIGH (ref 70–99)
Potassium: 4.8 meq/L (ref 3.5–5.1)
Sodium: 139 meq/L (ref 135–145)
Total Bilirubin: 0.5 mg/dL (ref 0.2–1.2)
Total Protein: 7.7 g/dL (ref 6.0–8.3)

## 2023-06-30 LAB — CBC
HCT: 45.2 % (ref 39.0–52.0)
Hemoglobin: 15.2 g/dL (ref 13.0–17.0)
MCHC: 33.5 g/dL (ref 30.0–36.0)
MCV: 93.8 fL (ref 78.0–100.0)
Platelets: 243 10*3/uL (ref 150.0–400.0)
RBC: 4.82 Mil/uL (ref 4.22–5.81)
RDW: 12.6 % (ref 11.5–15.5)
WBC: 6.5 10*3/uL (ref 4.0–10.5)

## 2023-06-30 LAB — LIPID PANEL
Cholesterol: 162 mg/dL (ref 0–200)
HDL: 48.7 mg/dL (ref >39.00)
LDL Cholesterol: 92 mg/dL (ref 0–99)
NonHDL: 113.68
Total CHOL/HDL Ratio: 3
Triglycerides: 110 mg/dL (ref 0.0–149.0)
VLDL: 22 mg/dL (ref 0.0–40.0)

## 2023-06-30 LAB — PSA: PSA: 2.63 ng/mL (ref 0.10–4.00)

## 2023-06-30 MED ORDER — ROSUVASTATIN CALCIUM 20 MG PO TABS: 20.0000 mg | ORAL_TABLET | Freq: Every day | ORAL | 3 refills | Status: DC

## 2023-06-30 MED ORDER — MIRTAZAPINE 15 MG PO TABS: 15.0000 mg | ORAL_TABLET | Freq: Every day | ORAL | 3 refills | Status: DC

## 2023-06-30 MED ORDER — DIAZEPAM 5 MG PO TABS: 5.0000 mg | ORAL_TABLET | Freq: Three times a day (TID) | ORAL | 0 refills | Status: DC

## 2023-06-30 NOTE — Patient Instructions (Addendum)
It was great seeing you today   We will follow up with you regarding your lab work   Please let me know if you need anything   Please get the shingles vaccination and Tetanus booster.

## 2023-06-30 NOTE — Progress Notes (Signed)
Subjective:    Patient ID: John Holland, male    DOB: Nov 22, 1950, 73 y.o.   MRN: [id number]  HPI Patient presents for yearly preventative medicine examination. He is a pleasant 73 year old male who  has a past medical history of Anxiety, Arthritis, Bipolar 1 disorder (HCC), Depression, Hepatitis C (12/05/2015), Hyperlipidemia, and Macular degeneration of left eye.  Hyperlipidemia-managed with simvastatin 20 mg daily.  Denies myalgia or fatigue Lab Results  Component Value Date   CHOL 188 11/13/2021   HDL 41.40 11/13/2021   LDLCALC 108 (H) 11/13/2021   TRIG 194.0 (H) 11/13/2021   CHOLHDL 5 11/13/2021   Essential hypertension-managed with lisinopril 10 mg daily.  He denies dizziness, lightheadedness, chest pain, or shortness of breath. He did not take his medication prior to the appointment. Does not check at home but has a cuff.  BP Readings from Last 3 Encounters:  06/30/23 138/80  06/01/22 138/80  03/03/22 136/70    Anxiety/depression - currently managed with Valium 5 mg 3 times daily and Remeron 7.5 mg QHS. He feels as though his symptoms are well controlled.   BPH-asymptomatic  Macular Degeneration of left eye - diagnosed in March of this year. Seeing Dr. Luciana Axe. Has received two injections and reports improvement   All immunizations and health maintenance protocols were reviewed with the patient and needed orders were placed.  Appropriate screening laboratory values were ordered for the patient including screening of hyperlipidemia, renal function and hepatic function. If indicated by BPH, a PSA was ordered.  Medication reconciliation,  past medical history, social history, problem list and allergies were reviewed in detail with the patient  Goals were established with regard to weight loss, exercise, and  diet in compliance with medications  He is overdue for routine colon cancer screening, recall letter was sent in May 2023. He does not plan on doing another one    He would    Review of Systems  Constitutional: Negative.   HENT: Negative.    Eyes: Negative.   Respiratory: Negative.    Cardiovascular: Negative.   Gastrointestinal: Negative.   Endocrine: Negative.   Genitourinary: Negative.   Musculoskeletal: Negative.   Skin: Negative.   Allergic/Immunologic: Negative.   Neurological: Negative.   Hematological: Negative.   Psychiatric/Behavioral: Negative.    All other systems reviewed and are negative.  Past Medical History:  Diagnosis Date   Anxiety    Arthritis    Bipolar 1 disorder (HCC)    Depression    Hepatitis C 12/05/2015   Hyperlipidemia    Macular degeneration of left eye     Social History   Socioeconomic History   Marital status: Married    Spouse name: Not on file   Number of children: Not on file   Years of education: Not on file   Highest education level: Not on file  Occupational History   Occupation: Administrator, Civil Service    Employer: [employer]  Tobacco Use   Smoking status: Some Days    Current packs/day: 0.00    Types: Cigarettes    Last attempt to quit: 03/02/2011    Years since quitting: 12.3   Smokeless tobacco: Never  Substance and Sexual Activity   Alcohol use: No   Drug use: No   Sexual activity: Not on file  Other Topics Concern   Not on file  Social History Narrative   Works in a Scientist, research (medical)    Social Drivers of Dispensing optician  Resource Strain: Low Risk  (03/30/2023)   Overall Financial Resource Strain (CARDIA)    Difficulty of Paying Living Expenses: Not hard at all  Food Insecurity: No Food Insecurity (03/30/2023)   Hunger Vital Sign    Worried About Running Out of Food in the Last Year: Never true    Ran Out of Food in the Last Year: Never true  Transportation Needs: No Transportation Needs (03/30/2023)   PRAPARE - Administrator, Civil Service (Medical): No    Lack of Transportation (Non-Medical): No  Physical Activity: Insufficiently Active  (03/30/2023)   Exercise Vital Sign    Days of Exercise per Week: 2 days    Minutes of Exercise per Session: 30 min  Stress: No Stress Concern Present (03/30/2023)   [employer] of Occupational Health - Occupational Stress Questionnaire    Feeling of Stress : Not at all  Social Connections: Moderately Integrated (03/30/2023)   Social Connection and Isolation Panel [NHANES]    Frequency of Communication with Friends and Family: More than three times a week    Frequency of Social Gatherings with Friends and Family: More than three times a week    Attends Religious Services: More than 4 times per year    Active Member of [employer] or Organizations: Yes    Attends Banker Meetings: More than 4 times per year    Marital Status: Widowed  Intimate Partner Violence: Not At Risk (03/30/2023)   Humiliation, Afraid, Rape, and Kick questionnaire    Fear of Current or Ex-Partner: No    Emotionally Abused: No    Physically Abused: No    Sexually Abused: No    Past Surgical History:  Procedure Laterality Date   ANKLE FRACTURE SURGERY     age 74-lt   arthroscopy arm  2003   right elbow bone frag   COLONOSCOPY     ROTATOR CUFF REPAIR Right 2015?   ROTATOR CUFF REPAIR Left 04/2020    Family History  Problem Relation Age of Onset   COPD Mother    Heart disease Mother    Colon cancer Neg Hx    Stomach cancer Neg Hx     Allergies  Allergen Reactions   Codeine Phosphate Nausea And Vomiting    Current Outpatient Medications on File Prior to Visit  Medication Sig Dispense Refill   diazepam (VALIUM) 5 MG tablet TAKE 1 TABLET(5 MG) BY MOUTH EVERY 6 HOURS AS NEEDED FOR ANXIETY.Needs physical exam for further refills 120 tablet 0   lisinopril (ZESTRIL) 10 MG tablet TAKE 1 TABLET(10 MG) BY MOUTH DAILY 90 tablet 3   mirtazapine (REMERON) 15 MG tablet Take 1 tablet (15 mg total) by mouth at bedtime. NEEDS PHYSICAL EXAM FOR FURTHER REFILLS 90 tablet 0   mupirocin ointment (BACTROBAN)  2 % Apply 1 application topically 2 (two) times daily. 22 g 0   nystatin cream (MYCOSTATIN) Apply 1 application topically 2 (two) times daily. 30 g 3   rosuvastatin (CRESTOR) 20 MG tablet Take 1 tablet (20 mg total) by mouth daily. Needs physical exam for further refills 90 tablet 0   ondansetron (ZOFRAN) 8 MG tablet Take 1 tablet (8 mg total) by mouth every 6 (six) hours as needed for nausea or vomiting. (Patient not taking: Reported on 06/30/2023) 30 tablet 0   No current facility-administered medications on file prior to visit.    BP 138/80   Pulse 78   Temp 98 F (36.7 C) (Oral)   Ht 5'  7" (1.702 m)   Wt 151 lb (68.5 kg)   SpO2 97%   BMI 23.65 kg/m       Objective:   Physical Exam Vitals and nursing note reviewed.  Constitutional:      General: He is not in acute distress.    Appearance: Normal appearance. He is not ill-appearing.  HENT:     Head: Normocephalic and atraumatic.     Right Ear: Tympanic membrane, ear canal and external ear normal. There is no impacted cerumen.     Left Ear: Tympanic membrane, ear canal and external ear normal. There is no impacted cerumen.     Nose: Nose normal. No congestion or rhinorrhea.     Mouth/Throat:     Mouth: Mucous membranes are moist.     Pharynx: Oropharynx is clear.  Eyes:     Extraocular Movements: Extraocular movements intact.     Conjunctiva/sclera: Conjunctivae normal.     Pupils: Pupils are equal, round, and reactive to light.  Neck:     Vascular: No carotid bruit.  Cardiovascular:     Rate and Rhythm: Normal rate and regular rhythm.     Pulses: Normal pulses.     Heart sounds: No murmur heard.    No friction rub. No gallop.  Pulmonary:     Effort: Pulmonary effort is normal.     Breath sounds: Normal breath sounds.  Abdominal:     General: Abdomen is flat. Bowel sounds are normal. There is no distension.     Palpations: Abdomen is soft. There is no mass.     Tenderness: There is no abdominal tenderness. There is  no guarding or rebound.     Hernia: No hernia is present.  Musculoskeletal:        General: Normal range of motion.     Cervical back: Normal range of motion and neck supple.  Lymphadenopathy:     Cervical: No cervical adenopathy.  Skin:    General: Skin is warm and dry.     Capillary Refill: Capillary refill takes less than 2 seconds.  Neurological:     General: No focal deficit present.     Mental Status: He is alert and oriented to person, place, and time.  Psychiatric:        Mood and Affect: Mood normal.        Behavior: Behavior normal.        Thought Content: Thought content normal.        Judgment: Judgment normal.       Assessment & Plan:  1. Mixed hyperlipidemia (Primary) - Continue with Crestor 20 mg  - Exercise and eat healthy  - Follow up in one year or sooner if needed - Lipid panel; Future - CBC; Future - Comprehensive metabolic panel; Future - rosuvastatin (CRESTOR) 20 MG tablet; Take 1 tablet (20 mg total) by mouth daily.  Dispense: 90 tablet; Refill: 3  2. Essential hypertension - Well controlled. No change in medication  - Lipid panel; Future - CBC; Future - Comprehensive metabolic panel; Future  3. Depression with anxiety  - diazepam (VALIUM) 5 MG tablet; Take 1 tablet (5 mg total) by mouth every 8 (eight) hours.  Dispense: 270 tablet; Refill: 0 - mirtazapine (REMERON) 15 MG tablet; Take 1 tablet (15 mg total) by mouth at bedtime.  Dispense: 90 tablet; Refill: 3  4. Benign prostatic hyperplasia without lower urinary tract symptoms  - PSA; Future  5. Early stage nonexudative age-related macular degeneration of both eyes -  Per ophthalmology   Shirline Frees, NP

## 2023-07-01 ENCOUNTER — Other Ambulatory Visit: Payer: Self-pay | Admitting: Emergency Medicine

## 2023-07-01 ENCOUNTER — Other Ambulatory Visit (INDEPENDENT_AMBULATORY_CARE_PROVIDER_SITE_OTHER): Payer: Medicare Other

## 2023-07-01 ENCOUNTER — Other Ambulatory Visit: Payer: Self-pay | Admitting: Adult Health

## 2023-07-01 DIAGNOSIS — R7309 Other abnormal glucose: Secondary | ICD-10-CM | POA: Diagnosis not present

## 2023-07-01 LAB — HEMOGLOBIN A1C: Hgb A1c MFr Bld: 5.9 % (ref 4.6–6.5)

## 2023-08-02 DIAGNOSIS — D3131 Benign neoplasm of right choroid: Secondary | ICD-10-CM | POA: Diagnosis not present

## 2023-08-02 DIAGNOSIS — H353131 Nonexudative age-related macular degeneration, bilateral, early dry stage: Secondary | ICD-10-CM | POA: Diagnosis not present

## 2023-08-02 DIAGNOSIS — H353221 Exudative age-related macular degeneration, left eye, with active choroidal neovascularization: Secondary | ICD-10-CM | POA: Diagnosis not present

## 2023-08-02 DIAGNOSIS — H35352 Cystoid macular degeneration, left eye: Secondary | ICD-10-CM | POA: Diagnosis not present

## 2023-08-02 DIAGNOSIS — H353112 Nonexudative age-related macular degeneration, right eye, intermediate dry stage: Secondary | ICD-10-CM | POA: Diagnosis not present

## 2023-10-19 ENCOUNTER — Other Ambulatory Visit: Payer: Self-pay | Admitting: Adult Health

## 2023-10-19 DIAGNOSIS — I10 Essential (primary) hypertension: Secondary | ICD-10-CM

## 2023-11-01 ENCOUNTER — Other Ambulatory Visit: Payer: Self-pay | Admitting: Adult Health

## 2023-11-01 DIAGNOSIS — H35352 Cystoid macular degeneration, left eye: Secondary | ICD-10-CM | POA: Diagnosis not present

## 2023-11-01 DIAGNOSIS — H353131 Nonexudative age-related macular degeneration, bilateral, early dry stage: Secondary | ICD-10-CM | POA: Diagnosis not present

## 2023-11-01 DIAGNOSIS — H353221 Exudative age-related macular degeneration, left eye, with active choroidal neovascularization: Secondary | ICD-10-CM | POA: Diagnosis not present

## 2023-11-01 DIAGNOSIS — D3131 Benign neoplasm of right choroid: Secondary | ICD-10-CM | POA: Diagnosis not present

## 2023-11-01 DIAGNOSIS — H353112 Nonexudative age-related macular degeneration, right eye, intermediate dry stage: Secondary | ICD-10-CM | POA: Diagnosis not present

## 2023-11-01 DIAGNOSIS — F418 Other specified anxiety disorders: Secondary | ICD-10-CM

## 2023-11-01 NOTE — Telephone Encounter (Signed)
 Okay for refill?

## 2023-11-07 DIAGNOSIS — H40013 Open angle with borderline findings, low risk, bilateral: Secondary | ICD-10-CM | POA: Diagnosis not present

## 2023-11-07 DIAGNOSIS — H3562 Retinal hemorrhage, left eye: Secondary | ICD-10-CM | POA: Diagnosis not present

## 2023-11-07 DIAGNOSIS — H3581 Retinal edema: Secondary | ICD-10-CM | POA: Diagnosis not present

## 2023-11-07 DIAGNOSIS — Z961 Presence of intraocular lens: Secondary | ICD-10-CM | POA: Diagnosis not present

## 2024-01-31 DIAGNOSIS — Z961 Presence of intraocular lens: Secondary | ICD-10-CM | POA: Diagnosis not present

## 2024-01-31 DIAGNOSIS — H353221 Exudative age-related macular degeneration, left eye, with active choroidal neovascularization: Secondary | ICD-10-CM | POA: Diagnosis not present

## 2024-01-31 DIAGNOSIS — H35352 Cystoid macular degeneration, left eye: Secondary | ICD-10-CM | POA: Diagnosis not present

## 2024-01-31 DIAGNOSIS — H353112 Nonexudative age-related macular degeneration, right eye, intermediate dry stage: Secondary | ICD-10-CM | POA: Diagnosis not present

## 2024-01-31 DIAGNOSIS — H353131 Nonexudative age-related macular degeneration, bilateral, early dry stage: Secondary | ICD-10-CM | POA: Diagnosis not present

## 2024-01-31 DIAGNOSIS — D3131 Benign neoplasm of right choroid: Secondary | ICD-10-CM | POA: Diagnosis not present

## 2024-04-02 DIAGNOSIS — Z23 Encounter for immunization: Secondary | ICD-10-CM | POA: Diagnosis not present

## 2024-06-12 ENCOUNTER — Other Ambulatory Visit: Payer: Self-pay | Admitting: Adult Health

## 2024-06-12 DIAGNOSIS — F418 Other specified anxiety disorders: Secondary | ICD-10-CM

## 2024-06-12 NOTE — Telephone Encounter (Signed)
 Okay for refill?

## 2024-06-21 ENCOUNTER — Other Ambulatory Visit: Payer: Self-pay | Admitting: Adult Health

## 2024-06-21 DIAGNOSIS — E782 Mixed hyperlipidemia: Secondary | ICD-10-CM

## 2024-06-25 ENCOUNTER — Telehealth: Payer: Self-pay

## 2024-06-25 NOTE — Telephone Encounter (Signed)
 Copied from CRM [id number]. Topic: Clinical - Medication Question >> Jun 25, 2024  1:24 PM Charolett L wrote: Reason for CRM: Patient stated that he is requesting a call back to go over medication mirtazapine  (REMERON ) 15 MG tablet

## 2024-06-27 NOTE — Telephone Encounter (Signed)
 Left message to return phone call.

## 2024-06-28 NOTE — Telephone Encounter (Signed)
 noted

## 2024-06-28 NOTE — Telephone Encounter (Signed)
 Pt taking diazepam  TID pt stated that he was having night terrors and sweats. Pt stated that its helping with depression and wants to know if he still need to follow up.

## 2024-07-03 ENCOUNTER — Ambulatory Visit: Payer: Self-pay | Admitting: Adult Health

## 2024-07-03 ENCOUNTER — Encounter: Payer: Self-pay | Admitting: Adult Health

## 2024-07-03 ENCOUNTER — Ambulatory Visit: Admitting: Adult Health

## 2024-07-03 ENCOUNTER — Encounter: Payer: Medicare Other | Admitting: Adult Health

## 2024-07-03 VITALS — BP 120/82 | HR 98 | Temp 98.1°F | Ht 67.0 in | Wt 148.0 lb

## 2024-07-03 DIAGNOSIS — K649 Unspecified hemorrhoids: Secondary | ICD-10-CM

## 2024-07-03 DIAGNOSIS — F418 Other specified anxiety disorders: Secondary | ICD-10-CM

## 2024-07-03 DIAGNOSIS — R972 Elevated prostate specific antigen [PSA]: Secondary | ICD-10-CM

## 2024-07-03 DIAGNOSIS — Z87891 Personal history of nicotine dependence: Secondary | ICD-10-CM

## 2024-07-03 DIAGNOSIS — E782 Mixed hyperlipidemia: Secondary | ICD-10-CM

## 2024-07-03 DIAGNOSIS — Z0000 Encounter for general adult medical examination without abnormal findings: Secondary | ICD-10-CM

## 2024-07-03 DIAGNOSIS — R7309 Other abnormal glucose: Secondary | ICD-10-CM

## 2024-07-03 DIAGNOSIS — N400 Enlarged prostate without lower urinary tract symptoms: Secondary | ICD-10-CM

## 2024-07-03 DIAGNOSIS — I10 Essential (primary) hypertension: Secondary | ICD-10-CM

## 2024-07-03 LAB — COMPREHENSIVE METABOLIC PANEL WITH GFR
ALT: 17 U/L (ref 3–53)
AST: 21 U/L (ref 5–37)
Albumin: 5 g/dL (ref 3.5–5.2)
Alkaline Phosphatase: 58 U/L (ref 39–117)
BUN: 10 mg/dL (ref 6–23)
CO2: 28 meq/L (ref 19–32)
Calcium: 9.9 mg/dL (ref 8.4–10.5)
Chloride: 103 meq/L (ref 96–112)
Creatinine, Ser: 1.05 mg/dL (ref 0.40–1.50)
GFR: 70.06 mL/min
Glucose, Bld: 77 mg/dL (ref 70–99)
Potassium: 4.8 meq/L (ref 3.5–5.1)
Sodium: 141 meq/L (ref 135–145)
Total Bilirubin: 0.4 mg/dL (ref 0.2–1.2)
Total Protein: 7.7 g/dL (ref 6.0–8.3)

## 2024-07-03 LAB — LIPID PANEL
Cholesterol: 127 mg/dL (ref 28–200)
HDL: 50.8 mg/dL
LDL Cholesterol: 52 mg/dL (ref 10–99)
NonHDL: 76.4
Total CHOL/HDL Ratio: 3
Triglycerides: 122 mg/dL (ref 10.0–149.0)
VLDL: 24.4 mg/dL (ref 0.0–40.0)

## 2024-07-03 LAB — CBC
HCT: 42.6 % (ref 39.0–52.0)
Hemoglobin: 14.6 g/dL (ref 13.0–17.0)
MCHC: 34.3 g/dL (ref 30.0–36.0)
MCV: 92.6 fl (ref 78.0–100.0)
Platelets: 247 10*3/uL (ref 150.0–400.0)
RBC: 4.6 Mil/uL (ref 4.22–5.81)
RDW: 12.8 % (ref 11.5–15.5)
WBC: 6.9 10*3/uL (ref 4.0–10.5)

## 2024-07-03 LAB — HEMOGLOBIN A1C: Hgb A1c MFr Bld: 5.7 % (ref 4.6–6.5)

## 2024-07-03 LAB — PSA: PSA: 5.29 ng/mL — ABNORMAL HIGH (ref 0.10–4.00)

## 2024-07-03 MED ORDER — HYDROCORTISONE ACETATE 25 MG RE SUPP: 25.0000 mg | Freq: Two times a day (BID) | RECTAL | 0 refills | Status: AC

## 2024-07-03 NOTE — Progress Notes (Signed)
 "  Complete physical exam  Patient: John Holland   DOB: 14-Jun-1950   74 y.o. Male  MRN: [id number]  Subjective:    Chief Complaint  Patient presents with   Annual Exam    John Holland is a 74 y.o. male who presents today for a complete physical exam. He reports that he feels well overall and has no complaints. When asked about depression, he reports, I feel better than I ever have. I even stopped taking my mirtazapine . Patient states that he is getting 8+ hours of uninterrupted sleep every night. He states that he is not eating a healthy diet and is not physically active. He also reports that he continues to smoke cigarettes1 PPD and is not currently motivated to quit.   Most recent fall risk assessment:    07/03/2024    1:56 PM  Fall Risk   Falls in the past year? 0  Number falls in past yr: 0  Injury with Fall? 0  Risk for fall due to : No Fall Risks  Follow up Falls evaluation completed     Most recent depression screenings:    07/03/2024    1:57 PM 06/30/2023   11:26 AM  PHQ 2/9 Scores  PHQ - 2 Score 0 0  PHQ- 9 Score 0 0      Data saved with a previous flowsheet row definition   Vision: earlier today; no concerns Dental: annually  Immunizations: Current Low-dose CT Chest: ordered today   Patient Care Team: Merna Huxley, NP as PCP - General (Family Medicine) Alvan Ronal BRAVO, MD (Inactive) as PCP - Cardiology (Cardiology)   Show/hide medication list[1]  Review of Systems  Constitutional:  Negative for chills and fever.  HENT:  Positive for tinnitus. Negative for hearing loss.        Reports chronic tinnitus; no worsening  Eyes:  Negative for blurred vision and double vision.  Respiratory:  Negative for cough and wheezing.   Cardiovascular:  Negative for chest pain and palpitations.  Gastrointestinal:  Negative for abdominal pain and heartburn.  Genitourinary:  Negative for dysuria and urgency.  Musculoskeletal:  Negative for back pain, joint pain and  myalgias.  Skin:  Negative for rash.  Neurological:  Negative for dizziness and headaches.  Endo/Heme/Allergies:  Does not bruise/bleed easily.  Psychiatric/Behavioral:  Negative for depression. The patient is nervous/anxious.       Objective:     BP 120/82   Pulse 98   Temp 98.1 F (36.7 C) (Oral)   Ht 5' 7 (1.702 m)   Wt 67.1 kg   SpO2 96%   BMI 23.18 kg/m    Wt Readings from Last 3 Encounters:  07/03/24 67.1 kg  06/30/23 68.5 kg  03/30/23 68 kg   Lab Results  Component Value Date   HGBA1C 5.9 07/01/2023   Lipid Panel     Component Value Date/Time   CHOL 162 06/30/2023 1152   TRIG 110.0 06/30/2023 1152   HDL 48.70 06/30/2023 1152   CHOLHDL 3 06/30/2023 1152   VLDL 22.0 06/30/2023 1152   LDLCALC 92 06/30/2023 1152    BP Readings from Last 3 Encounters:  07/03/24 120/82  06/30/23 138/80  06/01/22 138/80    Physical Exam Vitals reviewed.  Constitutional:      General: He is not in acute distress.    Appearance: Normal appearance. He is not ill-appearing.  HENT:     Head: Normocephalic and atraumatic.     Nose: Nose normal. No  congestion or rhinorrhea.     Mouth/Throat:     Mouth: Mucous membranes are moist.     Pharynx: Oropharynx is clear. No oropharyngeal exudate or posterior oropharyngeal erythema.  Eyes:     Extraocular Movements: Extraocular movements intact.     Conjunctiva/sclera: Conjunctivae normal.     Pupils: Pupils are equal, round, and reactive to light.  Neck:     Vascular: No carotid bruit.  Cardiovascular:     Rate and Rhythm: Normal rate and regular rhythm.     Pulses: Normal pulses.     Heart sounds: Normal heart sounds.  Pulmonary:     Effort: Pulmonary effort is normal.     Breath sounds: Normal breath sounds.  Abdominal:     General: Bowel sounds are normal.     Palpations: Abdomen is soft. There is no hepatomegaly, splenomegaly or mass.  Musculoskeletal:        General: Normal range of motion.     Cervical back: Normal  range of motion and neck supple.  Lymphadenopathy:     Cervical: No cervical adenopathy.  Skin:    General: Skin is warm and dry.     Capillary Refill: Capillary refill takes less than 2 seconds.  Neurological:     Mental Status: He is alert and oriented to person, place, and time.  Psychiatric:        Mood and Affect: Mood normal.        Behavior: Behavior normal.        Assessment & Plan:    Routine Health Maintenance and Physical Exam  Immunization History  Administered Date(s) Administered   Fluad Quad(high Dose 65+) 02/22/2019, 03/11/2020, 02/17/2022   Fluad Trivalent(High Dose 65+) 04/02/2024   Hepatitis A, Adult 06/20/2009   Hepatitis B, ADULT 06/20/2009   INFLUENZA, HIGH DOSE SEASONAL PF 04/08/2017, 05/16/2023   Influenza,inj,Quad PF,6+ Mos 03/23/2016   Influenza-Unspecified 01/05/2018   PFIZER Comirnaty(Gray Top)Covid-19 Tri-Sucrose Vaccine 09/27/2020   PFIZER(Purple Top)SARS-COV-2 Vaccination 06/28/2019, 07/19/2019, 03/11/2020   Pfizer Covid-19 Vaccine Bivalent Booster 67yrs & up 03/25/2021   Pfizer(Comirnaty)Fall Seasonal Vaccine 12 years and older 04/12/2022, 05/16/2023, 04/02/2024   Pneumococcal Conjugate-13 10/17/2015   Pneumococcal Polysaccharide-23 12/07/2016   Td 06/07/2006   Tdap 07/12/2023   Zoster Recombinant(Shingrix) 07/12/2023, 09/14/2023    Health Maintenance  Topic Date Due   Medicare Annual Wellness (AWV)  03/29/2024   Colonoscopy  06/29/2025 (Originally 10/26/2021)   COVID-19 Vaccine (9 - 2025-26 season) 10/01/2024   DTaP/Tdap/Td (3 - Td or Tdap) 07/11/2033   Pneumococcal Vaccine: 50+ Years  Completed   Influenza Vaccine  Completed   Hepatitis C Screening  Completed   Zoster Vaccines- Shingrix  Completed   Meningococcal B Vaccine  Aged Out   Hepatitis B Vaccines 19-59 Average Risk  Discontinued   1. Routine general medical examination at a health care facility (Primary) - Emphasized healthy diet and exercise  2. Mixed hyperlipidemia -  Continue rosuvastatin  20 mg by mouth daily - CBC, CMP, Lipid Panel, and HgbA1c today  3. Depression with anxiety - Stable. Denies concerns with depression or anxiety.  - Patient self-discontinued mirtazapine  - Continue valium  5 mg by mouth 3 times daily  4. Essential hypertension - Stable - Emphasized healthy diet and exercise - Continue lisinopril  10 mg by mouth daily as prescribed  5. Elevated glucose - Emphasized healthy diet and exercise - CBC, CMP, Lipid Panel, and HgbA1c   6. Benign prostatic hyperplasia without lower urinary tract symptoms - PSA today  7.  Hemorrhoids, unspecified hemorrhoid type - Drink lots of water. Increase activity. Eat a high fiber diet.  - Anusol  as directed   8. Tobacco abuse - Patient counseled on smoking cessation. He reports he is not ready to quit at this time. - Will use Nicorette lozenges when he is ready.  - 1 PPD x 40+ years (40 pack-year) - Low dose CT chest   Debby CHRISTELLA Borer, RN FNP Student     [1]  Outpatient Medications Prior to Visit  Medication Sig   diazepam  (VALIUM ) 5 MG tablet TAKE 1 TABLET(5 MG) BY MOUTH EVERY 8 HOURS   lisinopril  (ZESTRIL ) 10 MG tablet TAKE 1 TABLET(10 MG) BY MOUTH DAILY   Multiple Vitamins-Minerals (OCUVITE-LUTEIN PO) Take by mouth.   rosuvastatin  (CRESTOR ) 20 MG tablet TAKE 1 TABLET(20 MG) BY MOUTH DAILY   [DISCONTINUED] mirtazapine  (REMERON ) 15 MG tablet Take 1 tablet (15 mg total) by mouth at bedtime. (Patient not taking: Reported on 07/03/2024)   [DISCONTINUED] mupirocin  ointment (BACTROBAN ) 2 % Apply 1 application topically 2 (two) times daily.   [DISCONTINUED] nystatin  cream (MYCOSTATIN ) Apply 1 application topically 2 (two) times daily.   No facility-administered medications prior to visit.   "

## 2024-07-03 NOTE — Patient Instructions (Addendum)
 It was great seeing you today   We will follow up with you regarding your lab work   Please let me know if you need anything    Someone will call you to schedule your low dose CT scan of the chest

## 2024-07-05 ENCOUNTER — Telehealth: Payer: Self-pay

## 2024-07-05 NOTE — Telephone Encounter (Signed)
 Copied from CRM #[id number]. Topic: Clinical - Lab/Test Results >> Jul 04, 2024  5:03 PM Delon DASEN wrote: Reason for CRM: read results verbatim, advised of referral

## 2024-07-11 ENCOUNTER — Telehealth: Payer: Self-pay | Admitting: Adult Health

## 2024-07-11 NOTE — Telephone Encounter (Signed)
 Copied from CRM #[id number]. Topic: General - Other >> Jul 10, 2024  5:23 PM China J wrote: Reason for CRM: Patient is returning a call from Kendra. I was able to look through his chart and find that Marjorie confirmed the urology referral, which the patient is aware of. If there is anything else, please call patient at [phone].

## 2024-07-11 NOTE — Telephone Encounter (Signed)
Noted.  Nothing else needed.

## 2024-07-30 ENCOUNTER — Ambulatory Visit
